# Patient Record
Sex: Female | Born: 1980 | Race: White | Hispanic: Yes | Marital: Single | Smoking: Never smoker
Health system: Southern US, Community
[De-identification: ages and names within clinical notes are randomized; demographics above are authoritative.]

## PROBLEM LIST (undated history)

## (undated) ENCOUNTER — Inpatient Hospital Stay (HOSPITAL_COMMUNITY): Payer: Self-pay

## (undated) DIAGNOSIS — A749 Chlamydial infection, unspecified: Secondary | ICD-10-CM

## (undated) DIAGNOSIS — O039 Complete or unspecified spontaneous abortion without complication: Secondary | ICD-10-CM

## (undated) DIAGNOSIS — F419 Anxiety disorder, unspecified: Secondary | ICD-10-CM

## (undated) DIAGNOSIS — R7303 Prediabetes: Secondary | ICD-10-CM

## (undated) DIAGNOSIS — M1990 Unspecified osteoarthritis, unspecified site: Secondary | ICD-10-CM

## (undated) DIAGNOSIS — E785 Hyperlipidemia, unspecified: Secondary | ICD-10-CM

## (undated) HISTORY — DX: Unspecified osteoarthritis, unspecified site: M19.90

## (undated) HISTORY — DX: Prediabetes: R73.03

## (undated) HISTORY — PX: NO PAST SURGERIES: SHX2092

## (undated) HISTORY — DX: Hyperlipidemia, unspecified: E78.5

---

## 2007-03-26 ENCOUNTER — Emergency Department (HOSPITAL_COMMUNITY): Admission: EM | Admit: 2007-03-26 | Discharge: 2007-03-26 | Payer: Self-pay | Admitting: Emergency Medicine

## 2007-10-06 ENCOUNTER — Emergency Department (HOSPITAL_COMMUNITY): Admission: EM | Admit: 2007-10-06 | Discharge: 2007-10-07 | Payer: Self-pay | Admitting: Emergency Medicine

## 2007-11-17 ENCOUNTER — Emergency Department (HOSPITAL_COMMUNITY): Admission: EM | Admit: 2007-11-17 | Discharge: 2007-11-17 | Payer: Self-pay | Admitting: Emergency Medicine

## 2007-11-26 ENCOUNTER — Emergency Department (HOSPITAL_COMMUNITY): Admission: EM | Admit: 2007-11-26 | Discharge: 2007-11-27 | Payer: Self-pay | Admitting: Emergency Medicine

## 2007-12-03 ENCOUNTER — Emergency Department (HOSPITAL_COMMUNITY): Admission: EM | Admit: 2007-12-03 | Discharge: 2007-12-03 | Payer: Self-pay | Admitting: Emergency Medicine

## 2007-12-16 ENCOUNTER — Emergency Department (HOSPITAL_COMMUNITY): Admission: EM | Admit: 2007-12-16 | Discharge: 2007-12-17 | Payer: Self-pay | Admitting: Emergency Medicine

## 2008-01-18 ENCOUNTER — Ambulatory Visit (HOSPITAL_COMMUNITY): Admission: RE | Admit: 2008-01-18 | Discharge: 2008-01-18 | Payer: Self-pay | Admitting: Obstetrics & Gynecology

## 2008-03-18 ENCOUNTER — Ambulatory Visit (HOSPITAL_COMMUNITY): Admission: RE | Admit: 2008-03-18 | Discharge: 2008-03-18 | Payer: Self-pay | Admitting: Obstetrics & Gynecology

## 2008-04-14 ENCOUNTER — Ambulatory Visit: Payer: Self-pay | Admitting: Physician Assistant

## 2008-04-14 ENCOUNTER — Inpatient Hospital Stay (HOSPITAL_COMMUNITY): Admission: AD | Admit: 2008-04-14 | Discharge: 2008-04-14 | Payer: Self-pay | Admitting: Family Medicine

## 2008-05-16 ENCOUNTER — Inpatient Hospital Stay (HOSPITAL_COMMUNITY): Admission: AD | Admit: 2008-05-16 | Discharge: 2008-05-16 | Payer: Self-pay | Admitting: Family Medicine

## 2008-05-23 ENCOUNTER — Inpatient Hospital Stay (HOSPITAL_COMMUNITY): Admission: AD | Admit: 2008-05-23 | Discharge: 2008-05-25 | Payer: Self-pay | Admitting: Obstetrics & Gynecology

## 2008-05-23 ENCOUNTER — Ambulatory Visit: Payer: Self-pay | Admitting: Obstetrics and Gynecology

## 2008-12-25 ENCOUNTER — Emergency Department (HOSPITAL_COMMUNITY): Admission: EM | Admit: 2008-12-25 | Discharge: 2008-12-25 | Payer: Self-pay | Admitting: Emergency Medicine

## 2009-07-06 IMAGING — US US OB DETAIL+14 WK
2 series · 14 of 28 positions shown · non-contrast
Comparison: none

OBSTETRICAL ULTRASOUND:
 This ultrasound exam was performed in the [HOSPITAL] Ultrasound Department.  The OB US report was generated in the AS system, and faxed to the ordering physician.  This report is also available in [REDACTED] PACS.

[Series 1: us ob detail +14 wk · 1 of 6 slices shown (1 of 2)]
[im 6/6]
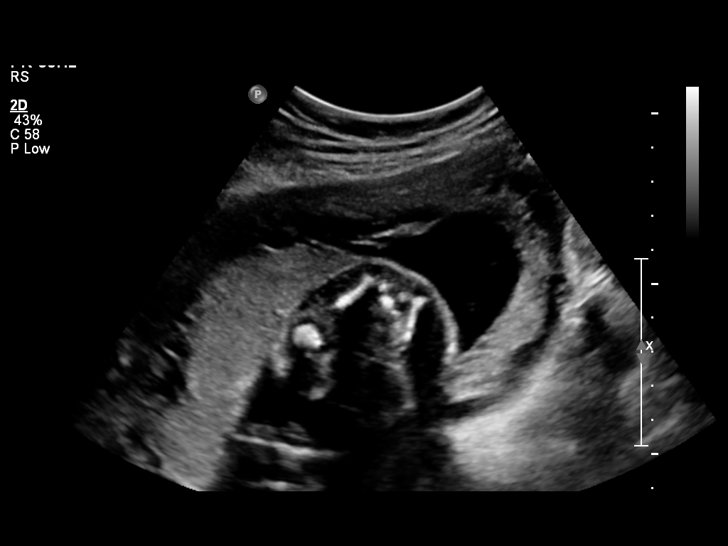

[Series 1: us ob detail +14 wk · 73 acquisitions, 13 frames shown (2 of 2)]
[im 3/73]
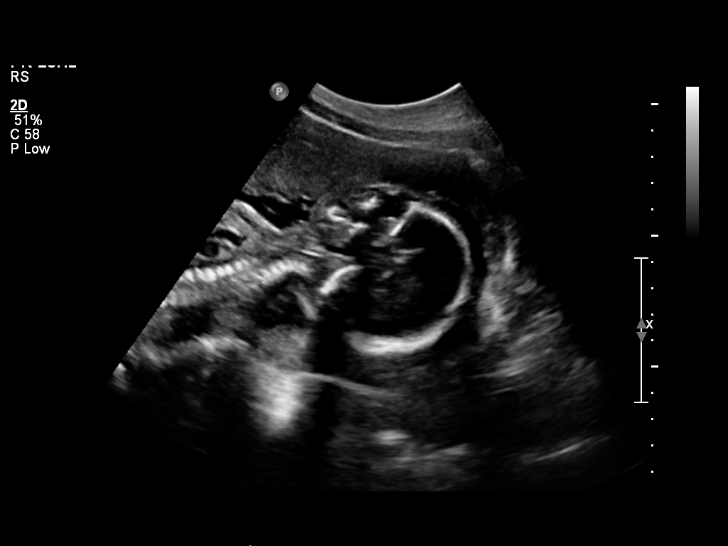
[im 9/73]
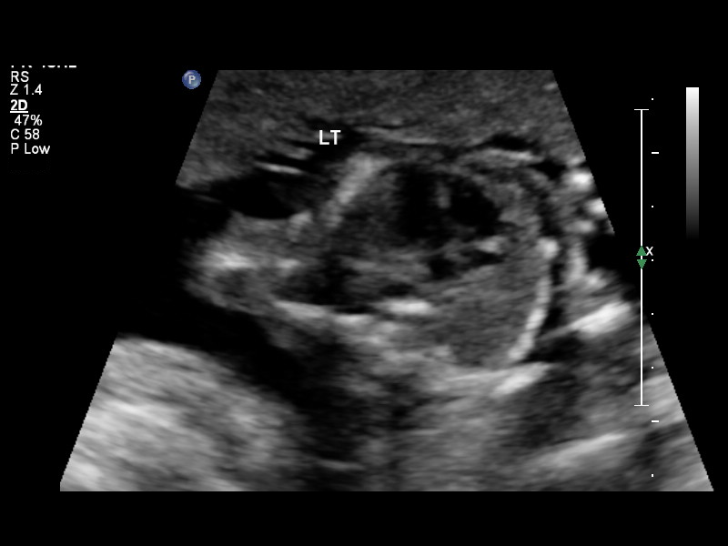
[im 15/73]
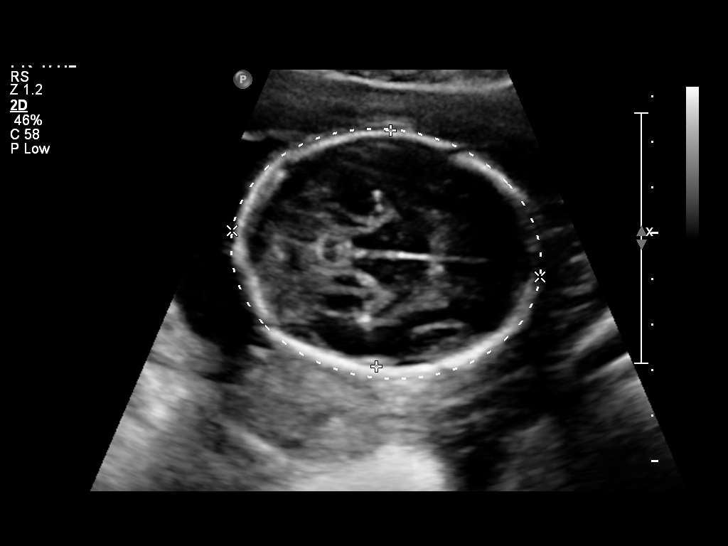
[im 21/73]
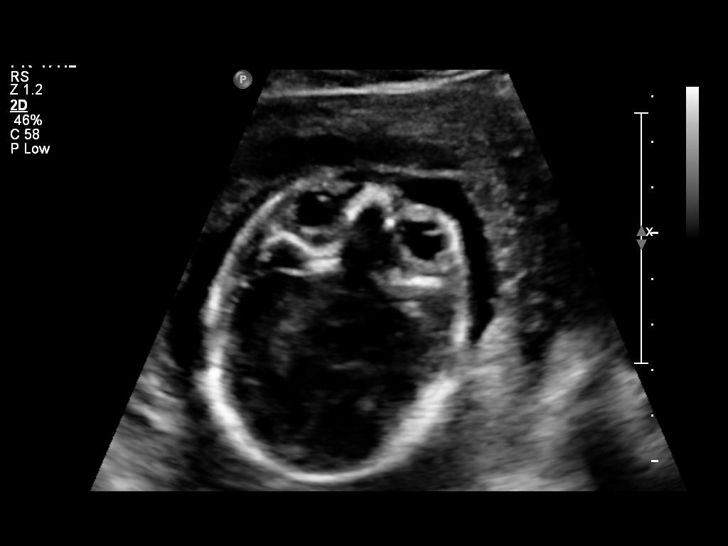
[im 26/73]
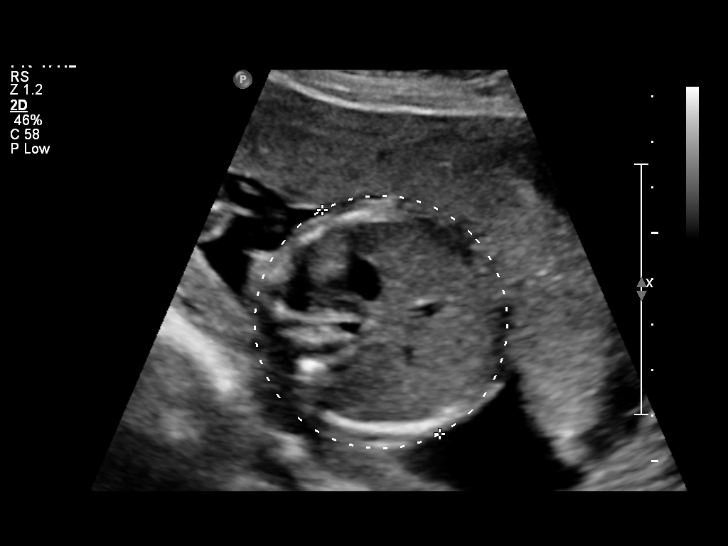
[im 32/73]
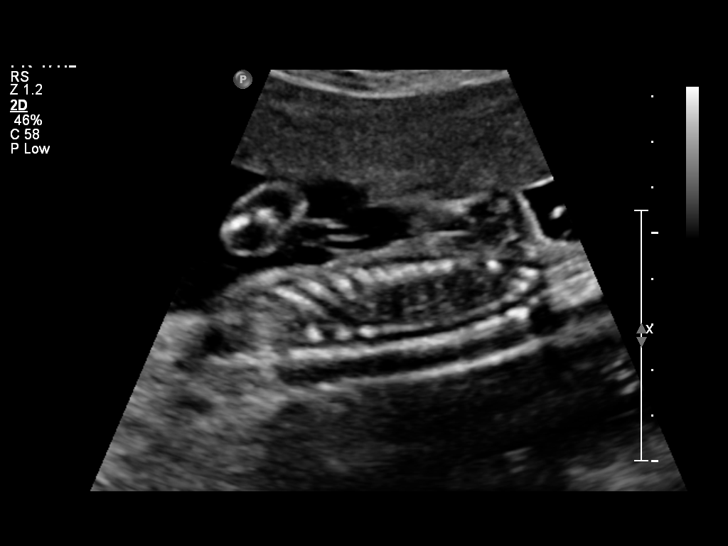
[im 38/73]
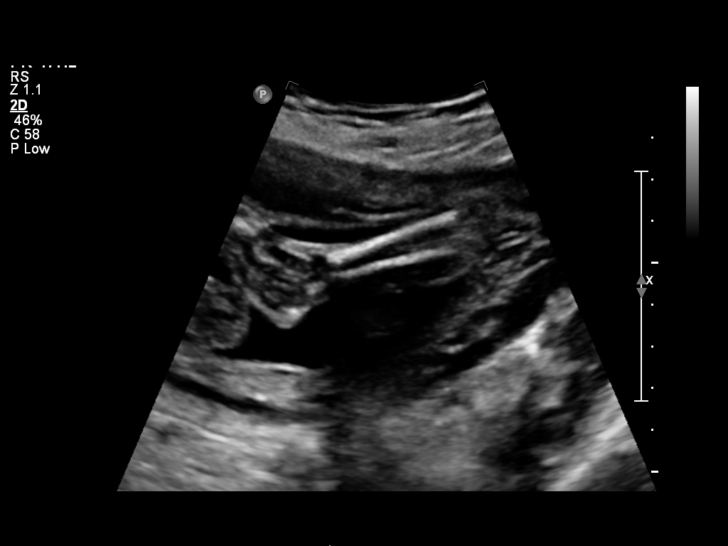
[im 44/73]
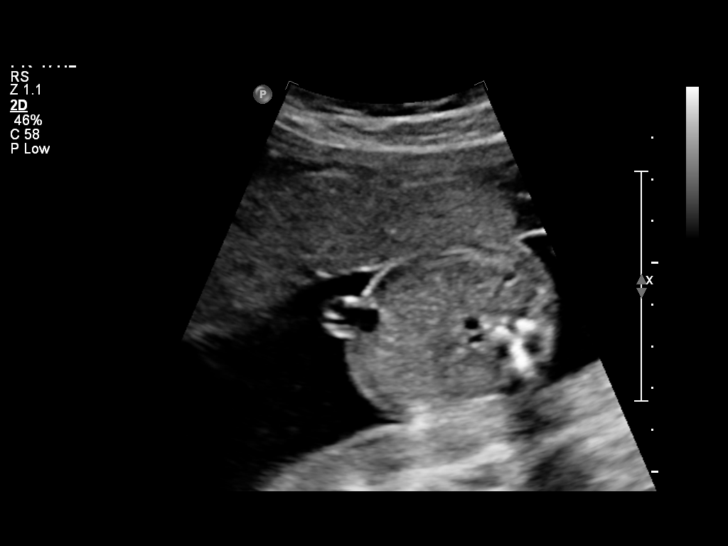
[im 49/73]
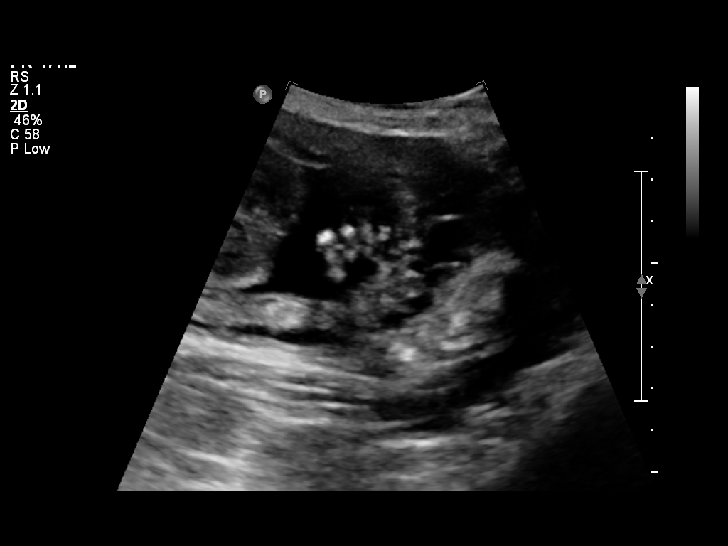
[im 55/73]
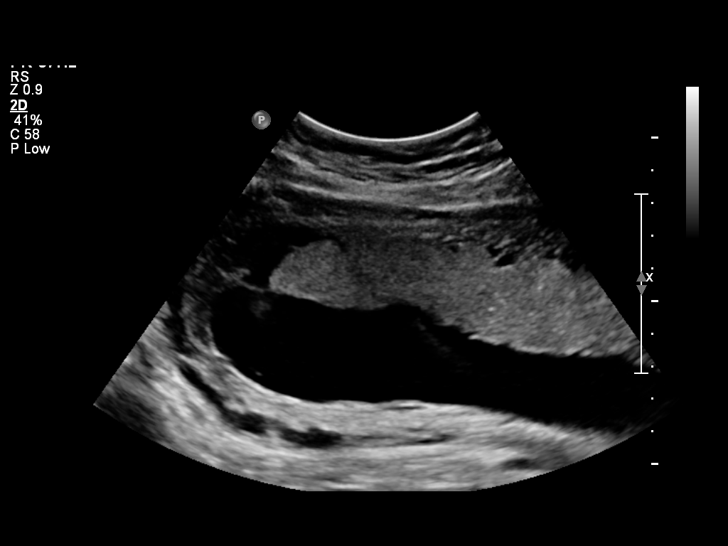
[im 61/73]
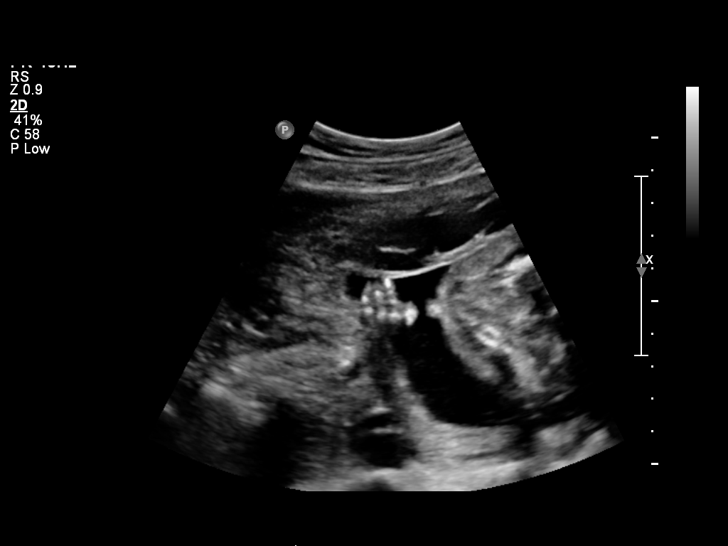
[im 67/73]
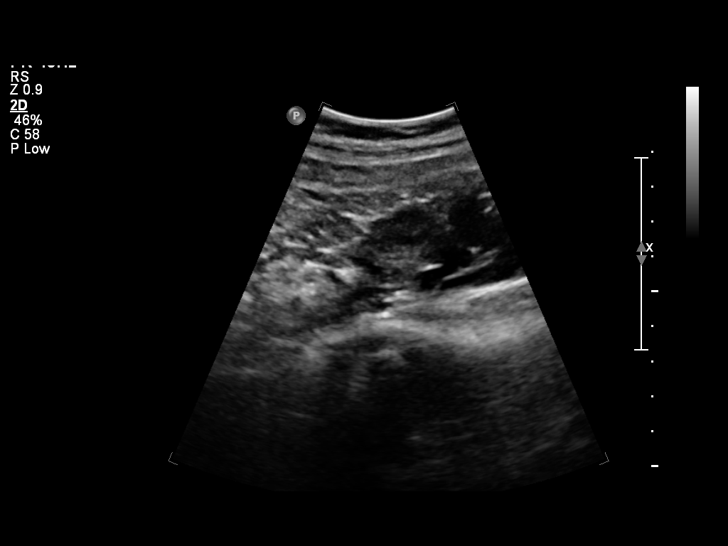
[im 73/73]
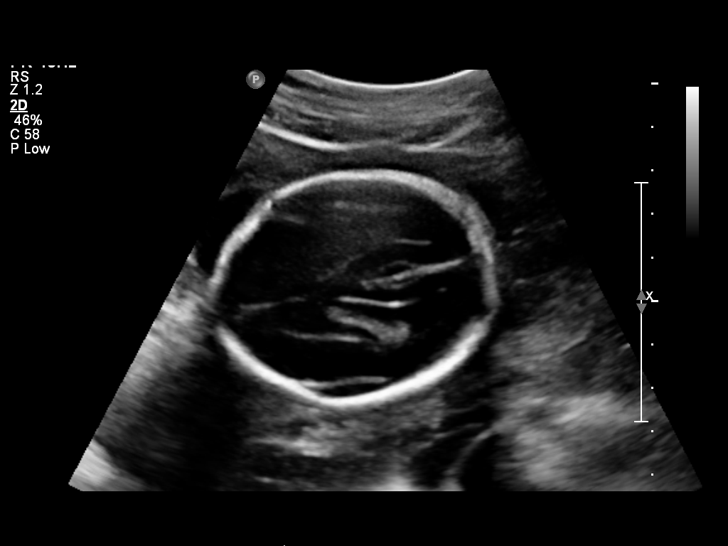

[14 of 28 positions shown; findings below may reference images not displayed]

IMPRESSION: See AS Obstetric US report.

## 2009-09-26 ENCOUNTER — Ambulatory Visit (HOSPITAL_COMMUNITY): Admission: RE | Admit: 2009-09-26 | Discharge: 2009-09-26 | Payer: Self-pay | Admitting: Family Medicine

## 2010-01-11 ENCOUNTER — Ambulatory Visit (HOSPITAL_COMMUNITY): Admission: RE | Admit: 2010-01-11 | Discharge: 2010-01-11 | Payer: Self-pay | Admitting: Family Medicine

## 2010-01-17 ENCOUNTER — Ambulatory Visit: Payer: Self-pay | Admitting: Advanced Practice Midwife

## 2010-01-17 ENCOUNTER — Inpatient Hospital Stay (HOSPITAL_COMMUNITY): Admission: AD | Admit: 2010-01-17 | Discharge: 2010-01-19 | Payer: Self-pay | Admitting: Family Medicine

## 2010-08-10 LAB — CBC
MCH: 31.2 pg (ref 26.0–34.0)
MCV: 94 fL (ref 78.0–100.0)
RBC: 3.75 MIL/uL — ABNORMAL LOW (ref 3.87–5.11)
RDW: 13.1 % (ref 11.5–15.5)

## 2010-08-10 LAB — RPR: RPR Ser Ql: NONREACTIVE

## 2011-02-21 LAB — URINALYSIS, ROUTINE W REFLEX MICROSCOPIC
Bilirubin Urine: NEGATIVE
Bilirubin Urine: NEGATIVE
Glucose, UA: NEGATIVE
Hgb urine dipstick: NEGATIVE
Nitrite: NEGATIVE
Protein, ur: NEGATIVE
Specific Gravity, Urine: 1.014
Urobilinogen, UA: 0.2
Urobilinogen, UA: 1
pH: 8
pH: 5.5

## 2011-02-21 LAB — CBC
HCT: 34.9 — ABNORMAL LOW
Hemoglobin: 11.9 — ABNORMAL LOW
MCHC: 34
MCV: 94.8
Platelets: 156
RDW: 12.7

## 2011-02-21 LAB — URINE MICROSCOPIC-ADD ON

## 2011-02-21 LAB — URINE CULTURE

## 2011-02-21 LAB — WET PREP, GENITAL
Clue Cells Wet Prep HPF POC: NONE SEEN
Yeast Wet Prep HPF POC: NONE SEEN

## 2011-02-21 LAB — GC/CHLAMYDIA PROBE AMP, URINE
Chlamydia, Swab/Urine, PCR: POSITIVE — AB
GC Probe Amp, Urine: NEGATIVE

## 2011-02-21 LAB — DIFFERENTIAL
Basophils Absolute: 0
Basophils Relative: 0
Lymphocytes Relative: 24

## 2011-02-21 LAB — POCT I-STAT, CHEM 8
Chloride: 106
Creatinine, Ser: 0.7
HCT: 35 — ABNORMAL LOW
Hemoglobin: 11.9 — ABNORMAL LOW
TCO2: 19

## 2011-02-21 LAB — RPR
RPR Ser Ql: NONREACTIVE
RPR Ser Ql: NONREACTIVE
RPR Ser Ql: NONREACTIVE

## 2011-02-21 LAB — GC/CHLAMYDIA PROBE AMP, GENITAL
Chlamydia, DNA Probe: POSITIVE — AB
GC Probe Amp, Genital: NEGATIVE
GC Probe Amp, Genital: NEGATIVE

## 2011-02-21 LAB — HCG, QUANTITATIVE, PREGNANCY: hCG, Beta Chain, Quant, S: 50800 — ABNORMAL HIGH

## 2011-02-22 LAB — URINALYSIS, ROUTINE W REFLEX MICROSCOPIC
Hgb urine dipstick: NEGATIVE
Nitrite: NEGATIVE
Specific Gravity, Urine: 1.01
Urobilinogen, UA: 0.2
pH: 7

## 2011-02-22 LAB — GC/CHLAMYDIA PROBE AMP, GENITAL: Chlamydia, DNA Probe: NEGATIVE

## 2011-02-22 LAB — URINE MICROSCOPIC-ADD ON

## 2011-02-22 LAB — URINE CULTURE

## 2011-02-22 LAB — WET PREP, GENITAL

## 2011-02-22 LAB — RPR: RPR Ser Ql: NONREACTIVE

## 2011-02-26 LAB — URINALYSIS, ROUTINE W REFLEX MICROSCOPIC
Ketones, ur: NEGATIVE
Nitrite: NEGATIVE
Specific Gravity, Urine: 1.025
pH: 6

## 2011-03-01 LAB — CBC
HCT: 39.2 % (ref 36.0–46.0)
Hemoglobin: 13.2 g/dL (ref 12.0–15.0)
RDW: 13.2 % (ref 11.5–15.5)

## 2011-03-01 LAB — URINALYSIS, ROUTINE W REFLEX MICROSCOPIC
Bilirubin Urine: NEGATIVE
Glucose, UA: NEGATIVE mg/dL
Hgb urine dipstick: NEGATIVE
Ketones, ur: NEGATIVE mg/dL
Nitrite: NEGATIVE
Protein, ur: NEGATIVE mg/dL
Specific Gravity, Urine: <1.005 — ABNORMAL LOW (ref 1.005–1.030)
Urobilinogen, UA: 0.2 mg/dL (ref 0.0–1.0)
pH: 6 (ref 5.0–8.0)

## 2011-03-01 LAB — GC/CHLAMYDIA PROBE AMP, GENITAL
Chlamydia, DNA Probe: NEGATIVE
GC Probe Amp, Genital: NEGATIVE

## 2011-03-01 LAB — WET PREP, GENITAL
Clue Cells Wet Prep HPF POC: NONE SEEN
Trich, Wet Prep: NONE SEEN
Yeast Wet Prep HPF POC: NONE SEEN

## 2011-04-17 ENCOUNTER — Emergency Department (HOSPITAL_COMMUNITY)
Admission: EM | Admit: 2011-04-17 | Discharge: 2011-04-17 | Disposition: A | Discharge status: Home / Self Care | Payer: Medicaid Other | Attending: Emergency Medicine | Admitting: Emergency Medicine

## 2011-04-17 ENCOUNTER — Emergency Department (HOSPITAL_COMMUNITY): Payer: Medicaid Other

## 2011-04-17 DIAGNOSIS — Z3490 Encounter for supervision of normal pregnancy, unspecified, unspecified trimester: Secondary | ICD-10-CM

## 2011-04-17 DIAGNOSIS — R5381 Other malaise: Secondary | ICD-10-CM | POA: Insufficient documentation

## 2011-04-17 DIAGNOSIS — N898 Other specified noninflammatory disorders of vagina: Secondary | ICD-10-CM | POA: Insufficient documentation

## 2011-04-17 DIAGNOSIS — R10813 Right lower quadrant abdominal tenderness: Secondary | ICD-10-CM | POA: Insufficient documentation

## 2011-04-17 DIAGNOSIS — R110 Nausea: Secondary | ICD-10-CM | POA: Insufficient documentation

## 2011-04-17 DIAGNOSIS — O99891 Other specified diseases and conditions complicating pregnancy: Secondary | ICD-10-CM | POA: Insufficient documentation

## 2011-04-17 DIAGNOSIS — R1031 Right lower quadrant pain: Secondary | ICD-10-CM | POA: Insufficient documentation

## 2011-04-17 DIAGNOSIS — R51 Headache: Secondary | ICD-10-CM | POA: Insufficient documentation

## 2011-04-17 LAB — CBC
HCT: 37.9 % (ref 36.0–46.0)
MCHC: 32.7 g/dL (ref 30.0–36.0)
Platelets: 185 10*3/uL (ref 150–400)
RDW: 12.8 % (ref 11.5–15.5)
WBC: 7.3 10*3/uL (ref 4.0–10.5)

## 2011-04-17 LAB — URINALYSIS, ROUTINE W REFLEX MICROSCOPIC
Bilirubin Urine: NEGATIVE
Leukocytes, UA: NEGATIVE
Nitrite: NEGATIVE
Specific Gravity, Urine: 1.031 — ABNORMAL HIGH (ref 1.005–1.030)
Urobilinogen, UA: 1 mg/dL (ref 0.0–1.0)
pH: 6 (ref 5.0–8.0)

## 2011-04-17 LAB — BASIC METABOLIC PANEL
CO2: 21 mEq/L (ref 19–32)
Chloride: 105 mEq/L (ref 96–112)
GFR calc Af Amer: >90 mL/min (ref >90)
Potassium: 3.5 mEq/L (ref 3.5–5.1)
Sodium: 136 mEq/L (ref 135–145)

## 2011-04-17 LAB — TYPE AND SCREEN: Antibody Screen: NEGATIVE

## 2011-04-17 LAB — DIFFERENTIAL
Basophils Absolute: 0 10*3/uL (ref 0.0–0.1)
Basophils Relative: 0 % (ref 0–1)
Lymphocytes Relative: 22 % (ref 12–46)
Monocytes Absolute: 0.5 10*3/uL (ref 0.1–1.0)
Neutro Abs: 5.1 10*3/uL (ref 1.7–7.7)
Neutrophils Relative %: 69 % (ref 43–77)

## 2011-04-17 LAB — HCG, QUANTITATIVE, PREGNANCY: hCG, Beta Chain, Quant, S: 16834 m[IU]/mL — ABNORMAL HIGH (ref <5)

## 2011-04-17 LAB — WET PREP, GENITAL
Clue Cells Wet Prep HPF POC: NONE SEEN
Yeast Wet Prep HPF POC: NONE SEEN

## 2011-04-17 MED ORDER — SODIUM CHLORIDE 0.9 % IV BOLUS (SEPSIS)
1000.0000 mL | Freq: Once | INTRAVENOUS | Status: AC
  Administered 2011-04-17: 1000 mL via INTRAVENOUS

## 2011-04-17 MED ORDER — ONDANSETRON HCL 4 MG/2ML IJ SOLN
4.0000 mg | Freq: Once | INTRAMUSCULAR | Status: AC
  Administered 2011-04-17: 4 mg via INTRAVENOUS
  Filled 2011-04-17: qty 2

## 2011-04-17 NOTE — ED Notes (Signed)
Patient is resting comfortably. 

## 2011-04-17 NOTE — ED Notes (Signed)
Patient states vaginal bleeding few days took pregnancy test Positive at home.  Patient states did not know she was pregnant.  Resting comfortably on stretcher.  Airway intact bilateral equal chest rise and fall.

## 2011-04-17 NOTE — ED Provider Notes (Signed)
Medical screening examination/treatment/procedure(s) were conducted as a shared visit with non-physician practitioner(s) and myself.  I personally evaluated the patient during the encounter  Please see my dictation for complete H&P  Cyndra Numbers, MD 04/17/11 2252

## 2011-04-17 NOTE — ED Notes (Signed)
Pt reports 1 week h/o RLQ abdominal pain and headache.  Pt reports pain radiates around to her back and across abdomen.  Pt reports vaginal bleeding x 3 days. Pt reports nausea.  Pt reports she is 2 months pregnant.

## 2011-04-17 NOTE — ED Provider Notes (Signed)
History     CSN: [id number] Arrival date & time: 04/17/2011  1:39 PM   First MD Initiated Contact with Patient 04/17/11 1400      Chief Complaint  Patient presents with  . Abdominal Pain    (Consider location/radiation/quality/duration/timing/severity/associated sxs/prior treatment) Patient is a 30 y.o. female presenting with abdominal pain.  Abdominal Pain The primary symptoms of the illness include abdominal pain, fatigue, nausea and vaginal bleeding. The primary symptoms of the illness do not include diarrhea, dysuria or vaginal discharge.  Symptoms associated with the illness do not include constipation or hematuria.   The patient is a 30 year old G6 P5 with last menstrual period on September 29 who presents today complaining of right lower quadrant abdominal pain. She's had some associated nausea and headache. She also describes lower back pain. The patient had vaginal bleeding that was noted for 4 days and occurred approximately 8 days ago. The patient did not know that she was pregnant at the time. She took a urine pregnancy test yesterday that was positive. The patient has had 5 prior vaginal deliveries without complications. Patient does not have any vaginal bleeding at this time. She has had no fevers but does endorse decreased appetite. She states that her pain is a 10 out 10. The patient does still have an appendix. She has never had surgery on her abdomen. She states that this is very different from previous pregnancies. She has a history of STDs but does not have any vaginal discharge that concerns her for this. There are no other associated or modifying factors. This patient is Spanish-speaking only and her complete physical exam we'll perform a myself in Spanish patient is comfortable my interpretation. History reviewed. No pertinent past medical history.  History reviewed. No pertinent past surgical history.  History reviewed. No pertinent family history.  History    Substance Use Topics  . Smoking status: Never Smoker   . Smokeless tobacco: Not on file  . Alcohol Use: No    OB History    Grav Para Term Preterm Abortions TAB SAB Ect Mult Living                  Review of Systems  Constitutional: Positive for appetite change and fatigue.  HENT: Negative.   Eyes: Negative.   Respiratory: Negative.   Cardiovascular: Negative.   Gastrointestinal: Positive for nausea and abdominal pain. Negative for diarrhea and constipation.  Genitourinary: Positive for vaginal bleeding. Negative for dysuria, hematuria and vaginal discharge.  Musculoskeletal: Negative.   Skin: Negative.   Neurological: Negative.   Hematological: Negative.   Psychiatric/Behavioral: Negative.   All other systems reviewed and are negative.    Allergies  Review of patient's allergies indicates no known allergies.  Home Medications  No current outpatient prescriptions on file.  BP 97/61  Pulse 66  Temp(Src) 97.8 F (36.6 C) (Oral)  Resp 14  SpO2 98%  LMP 02/23/2011  Physical Exam  Nursing note and vitals reviewed. Constitutional: She is oriented to person, place, and time. She appears well-developed and well-nourished. No distress.  HENT:  Head: Normocephalic and atraumatic.  Eyes: Conjunctivae and EOM are normal. Pupils are equal, round, and reactive to light.  Neck: Normal range of motion.  Cardiovascular: Normal rate, regular rhythm, normal heart sounds and intact distal pulses.  Exam reveals no gallop and no friction rub.   No murmur heard. Pulmonary/Chest: Effort normal and breath sounds normal. No respiratory distress. She has no wheezes. She has no rales.  Abdominal:  Normal appearance and bowel sounds are normal. There is tenderness in the right lower quadrant. There is tenderness at McBurney's point. There is no rigidity, no rebound, no guarding and no CVA tenderness.  Genitourinary:       Please see dictation by PA for GU exam.  Musculoskeletal: Normal  range of motion. She exhibits no edema and no tenderness.  Neurological: She is alert and oriented to person, place, and time. No cranial nerve deficit. She exhibits normal muscle tone. Coordination normal.  Skin: Skin is warm and dry. No rash noted.  Psychiatric: She has a normal mood and affect.    ED Course  Procedures (including critical care time)  Labs Reviewed  HCG, QUANTITATIVE, PREGNANCY - Abnormal; Notable for the following:    hCG, Beta Chain, Quant, [place] [address] (*)    All other components within normal limits  URINALYSIS, ROUTINE W REFLEX MICROSCOPIC - Abnormal; Notable for the following:    Specific Gravity, Urine 1.031 (*)    Ketones, ur 15 (*)    All other components within normal limits  WET PREP, GENITAL - Abnormal; Notable for the following:    WBC, Wet Prep HPF POC MODERATE (*)    All other components within normal limits  CBC  DIFFERENTIAL  BASIC METABOLIC PANEL  PREGNANCY, URINE  TYPE AND SCREEN  URINE CULTURE  GC/CHLAMYDIA PROBE AMP, GENITAL   Mr Abdomen Wo Contrast  04/17/2011  *RADIOLOGY REPORT*  Clinical Data: Pregnant, right lower quadrant pain, evaluate for appendicitis  MRI ABDOMEN WITHOUT CONTRAST  Technique:  Multiplanar multisequence MR imaging of the abdomen was performed. No intravenous contrast was administered.  Comparison: None.  Findings: Visualized bowel is unremarkable.  Appendix is not confidently visualized, but there are no inflammatory changes to suggest acute appendicitis.  Visualized portions of the liver and spleen are within normal limits.  The visualized kidneys are unremarkable.  No hydronephrosis.  The visualized bowel is within normal limits.  Intrauterine gestational sac.  Ovaries are unremarkable, noting a left corpus luteal cyst.  No pelvic ascites.  Bladder is within normal limits.  No focal osseous lesions.  IMPRESSION: No inflammatory changes to suggest acute appendicitis.  Gravid uterus.  Left corpus luteal cyst.  Original Report  Authenticated By: Charline Bills, M.D.   US Ob Comp Less 14 Wks  04/17/2011  *RADIOLOGY REPORT*  Clinical Data: Positive pregnancy test and right lower quadrant pain.  Evaluate for ectopic pregnancy.  OBSTETRIC <14 WK [place] AND TRANSVAGINAL OB US  Technique:  Both transabdominal and transvaginal ultrasound examinations were performed for complete evaluation of the gestation as well as the maternal uterus, adnexal regions, and pelvic cul-de-sac.  Transvaginal technique was performed to assess early pregnancy.  Comparison:  11/17/2007  Intrauterine gestational sac:  Visualized. Yolk sac: Present Embryo: Question an early fetal pole.  Cardiac Activity: None  MSD: 14  mm  6    w 2    d          [place] EDC: 12/09/2011  Maternal uterus/adnexae: There is a gestational sac along the right side of the uterine fundus.  There may be a very small fetal pole adjacent to the yolk sac.  Right ovary measures 2.1 x 2.6 x 1.3 cm.  Left ovary measures 4.2 x 2.5 x 2.4 cm. There is a hypoechoic structure within the left adnexa measuring up to 1.9 cm, suggesting a follicle or small cyst. No evidence for free fluid.  IMPRESSION: Study is positive for an intrauterine pregnancy. Gestational  age by ultrasound is 6 weeks and 2 days.  Original Report Authenticated By: Richarda Overlie, M.D.   US Ob Transvaginal  04/17/2011  *RADIOLOGY REPORT*  Clinical Data: Positive pregnancy test and right lower quadrant pain.  Evaluate for ectopic pregnancy.  OBSTETRIC <14 WK [place] AND TRANSVAGINAL OB US  Technique:  Both transabdominal and transvaginal ultrasound examinations were performed for complete evaluation of the gestation as well as the maternal uterus, adnexal regions, and pelvic cul-de-sac.  Transvaginal technique was performed to assess early pregnancy.  Comparison:  11/17/2007  Intrauterine gestational sac:  Visualized. Yolk sac: Present Embryo: Question an early fetal pole.  Cardiac Activity: None  MSD: 14  mm  6    w 2    d          [place] EDC:  12/09/2011  Maternal uterus/adnexae: There is a gestational sac along the right side of the uterine fundus.  There may be a very small fetal pole adjacent to the yolk sac.  Right ovary measures 2.1 x 2.6 x 1.3 cm.  Left ovary measures 4.2 x 2.5 x 2.4 cm. There is a hypoechoic structure within the left adnexa measuring up to 1.9 cm, suggesting a follicle or small cyst. No evidence for free fluid.  IMPRESSION: Study is positive for an intrauterine pregnancy. Gestational age by ultrasound is 6 weeks and 2 days.  Original Report Authenticated By: Richarda Overlie, M.D.     1. Abdominal pain, acute, right lower quadrant   2. Normal IUP (intrauterine pregnancy) on prenatal ultrasound       MDM  The patient was evaluated by myself. She did have positive urine pregnancy here. She was not currently having vaginal bleeding but was having right lower quadrant pain. Patient had workup ordered for possible ectopic pregnancy. Renal panel and CBC were within normal limits. The patient is Rh+. Her CBC showed no signs of anemia. Patient's urinalysis showed no bacteria urine or signs of infection. Patient had a beta quant of [id number].  Patient did have a transvaginal ultrasound that was positive for intrauterine pregnancy with visualized yolk sac as well as intrauterine gestational sac and possible early fetal pole. Gestational age by ultrasound is 6 weeks and 2 days. The patient did have pelvic exam performed and did have adnexal tenderness on the right. I spoke with the patient regarding her findings and she continued to be very concerned about her right lower quadrant pain. She also continued to have tenderness in this area. We discussed the risks and benefits of additional imaging. After speaking with radiology was felt that an MRI was appropriate tests performed. This demonstrated no signs of appendicitis. Patient was notified of results. Patient was comfortable with plan for discharge home and was given precautions for  reasons to return. She will followup with an OB/GYN and was discharged in good condition.       Cyndra Numbers, MD 04/17/11 2251

## 2011-04-17 NOTE — ED Provider Notes (Signed)
Pt's pelvic exam performed by me. Normal external genetelia. White thin vaginal discharge. Cervix closed, no CMT, no bleeding. Right adnexal tenderness. Uterus gravid.   Lottie Mussel, PA 04/17/11 2250

## 2011-04-18 LAB — URINE CULTURE: Culture  Setup Time: 201211211500

## 2011-05-25 ENCOUNTER — Encounter (HOSPITAL_COMMUNITY): Payer: Self-pay

## 2011-05-25 ENCOUNTER — Emergency Department (HOSPITAL_COMMUNITY)
Admission: AD | Admit: 2011-05-25 | Discharge: 2011-05-25 | Disposition: A | Discharge status: Other Acute Inpatient Hospital | Payer: Medicaid Other | Source: Ambulatory Visit | Attending: Obstetrics & Gynecology | Admitting: Obstetrics & Gynecology

## 2011-05-25 ENCOUNTER — Emergency Department (HOSPITAL_COMMUNITY): Payer: Self-pay

## 2011-05-25 DIAGNOSIS — N939 Abnormal uterine and vaginal bleeding, unspecified: Secondary | ICD-10-CM

## 2011-05-25 DIAGNOSIS — N898 Other specified noninflammatory disorders of vagina: Secondary | ICD-10-CM | POA: Insufficient documentation

## 2011-05-25 DIAGNOSIS — O0289 Other abnormal products of conception: Secondary | ICD-10-CM

## 2011-05-25 DIAGNOSIS — R1031 Right lower quadrant pain: Secondary | ICD-10-CM | POA: Insufficient documentation

## 2011-05-25 DIAGNOSIS — O020 Blighted ovum and nonhydatidiform mole: Secondary | ICD-10-CM

## 2011-05-25 HISTORY — DX: Chlamydial infection, unspecified: A74.9

## 2011-05-25 LAB — URINALYSIS, ROUTINE W REFLEX MICROSCOPIC
Glucose, UA: NEGATIVE mg/dL
Leukocytes, UA: NEGATIVE
pH: 6 (ref 5.0–8.0)

## 2011-05-25 LAB — WET PREP, GENITAL
Clue Cells Wet Prep HPF POC: NONE SEEN
Trich, Wet Prep: NONE SEEN
Yeast Wet Prep HPF POC: NONE SEEN

## 2011-05-25 MED ORDER — HYDROCODONE-ACETAMINOPHEN 5-500 MG PO TABS: 1.0000 | ORAL_TABLET | Freq: Four times a day (QID) | ORAL | Status: AC | PRN

## 2011-05-25 MED ORDER — MISOPROSTOL 200 MCG PO TABS: ORAL_TABLET | ORAL | Status: DC

## 2011-05-25 NOTE — ED Notes (Signed)
Patient transported to Ultrasound 

## 2011-05-25 NOTE — ED Provider Notes (Signed)
Pt not accepted by Ma Hillock on call MD since she has established relationship with [facility]. D/w Dr Debroah Loop (on call MD), accepting patient.  --V.Juliene Pina, MD  HPI Review of Systems Physical Exam Procedures

## 2011-05-25 NOTE — ED Notes (Signed)
Pt. Is approximately [redacted] weeks pregnant and she began mild bleeding last night with cramping.

## 2011-05-25 NOTE — ED Provider Notes (Signed)
History     Chief Complaint  Patient presents with  . Vaginal Bleeding  . Abdominal Cramping   HPIPatient's last menstrual period was 02/23/2011. [id number] Unknown Patient was seen at [facility] and transferred per Dr. Juliene Pina. Pt has appt at [facility] for [facility]. Delivered at [facility] 03/2010. US showed empty sac c/w anembryonic pregnancy, pt report cramping and light bleeding today, no tissue or clots.  OB History    Grav Para Term Preterm Abortions TAB SAB Ect Mult Living   6 5 5       5       Past Medical History  Diagnosis Date  . Chlamydia     Past Surgical History  Procedure Date  . No past surgeries     History reviewed. No pertinent family history.  History  Substance Use Topics  . Smoking status: Never Smoker   . Smokeless tobacco: Never Used  . Alcohol Use: No    Allergies: No Known Allergies Past Medical History  Diagnosis Date  . Chlamydia      (Not in a hospital admission)  ROS cramps, bleeding  Physical Exam   Blood pressure 104/71, pulse 66, temperature 98.7 F (37.1 C), temperature source Oral, resp. rate 20, height 5\' 3"  (1.6 m), weight 140 lb (63.504 kg), last menstrual period 02/23/2011, SpO2 97.00%.  Physical Exam NAD  Interpreter present Abd non tender, soft Pelvic  EBUS, Vagina, Cervix-small amount of blood, closed, not tender, uterus 6-8 weeks, no mass   *RADIOLOGY REPORT*  Clinical Data: Vaginal bleeding. The estimated gestational age by  LMP is 13 weeks 0 days.  OBSTETRIC <14 WK [place] AND TRANSVAGINAL OB US  Technique: Both transabdominal and transvaginal ultrasound  examinations were performed for complete evaluation of the  gestation as well as the maternal uterus, adnexal regions, and  pelvic cul-de-sac. Transvaginal technique was performed to assess  early pregnancy.  Comparison: Obstetric ultrasound 04/17/2011  Intrauterine gestational sac: Enlarged gestational sac is  visualized  Yolk sac: None  Embryo: None  MSD: 45 mm 10w 0 d  Maternal  uterus/adnexae:  A corpus luteum noted in the left ovary. Right ovary is normal.  No free pelvic fluid.  IMPRESSION:  Failed (anembryonic) first trimester pregnancy.  (Previously visualized yolk sac on the study of 04/17/2011 is no  longer present and no embryo is present on today's study. We would  expect to see an embryo at this time, given the appearances on the  prior ultrasound of 04/17/2011, and gestational sac size.)  Original Report Authenticated By: Britta Mccreedy, M.D.      Imaging     US OB Comp Less 14 Wks (Order #[id number]) on 05/25/2011 - Imaging Information           Early Intrauterine Pregnancy Failure  ___  Documented intrauterine pregnancy failure less than or equal to [redacted] weeks gestation  ___  No serious current illness  ___  Baseline Hgb greater than or equal to 10g/dl  ___  Patient has easily accessible transportation to the hospital  ___  Clear preference  ___  Practitioner/physician deems patient reliable  ___  Counseling by practitioner or physician  ___  Patient education by RN  ___  Consent form signed  ___  Rho-Gam given by RN if indicated  ___ Medication dispensed   ___   Cytotec 800 mcg  __   Intravaginally by patient at home         __   Intravaginally by RN in MAU  __   Rectally by patient at home        __   Rectally by RN in MAU  ___  Ibuprofen 600 mg 1 tablet by mouth every 6 hours as needed #30  ___  Hydrocodone/acetaminophen 5/325 mg by mouth every 4 to 6 hours as needed  ___  Phenergan 12.5 mg by mouth every 4 hours as needed for nausea     MAU Course  Procedures  MDM Discussed inevitable Ab, options for expectant mgmt, D&C, medical mgmt with cytotec  Assessment and Plan  Patient accepts cytotec therapy, precautions given , f/u at GYN clinic 10-14 days  ARNOLD,JAMES 05/25/2011, 2:53 PM

## 2011-05-25 NOTE — ED Notes (Signed)
MD spoke with pt about dx per interpretor phones.

## 2011-05-25 NOTE — ED Provider Notes (Signed)
History     CSN: [id number]  Arrival date & time 05/25/11  0830   First MD Initiated Contact with Patient 05/25/11 [phone]      Chief Complaint  Patient presents with  . Vaginal Bleeding   30 year old female that is, gravida 6 para 5, with last normal menstrual period on September 29. Was in the ED last month and had an ultrasound done showing a positive intrauterine pregnancy, gestational age [redacted] weeks and 2 days at that time on November 21. She apparently also had an MRI done  on November 21, which showed no inflammatory changes in the gravid uterus. Patient noticed some blood when she was wiping yesterday. She has also had low back pain. Denies any fever or vomiting. Patient states [facility]. She has had some mild pain in her right lower abdomen.  Patient has had no dizziness or syncope.    (Consider location/radiation/quality/duration/timing/severity/associated sxs/prior treatment) HPI  History reviewed. No pertinent past medical history.  History reviewed. No pertinent past surgical history.  History reviewed. No pertinent family history.  History  Substance Use Topics  . Smoking status: Never Smoker   . Smokeless tobacco: Not on file  . Alcohol Use: No    OB History    Grav Para Term Preterm Abortions TAB SAB Ect Mult Living   1               Review of Systems  All other systems reviewed and are negative.    Allergies  Review of patient's allergies indicates no known allergies.  Home Medications  No current outpatient prescriptions on file.  BP 106/60  Pulse 73  Temp(Src) 98.3 F (36.8 C) (Oral)  Resp 18  Ht 5\' 3"  (1.6 m)  Wt 140 lb (63.504 kg)  BMI 24.80 kg/m2  SpO2 97%  LMP 02/23/2011  Physical Exam  Nursing note and vitals reviewed. Constitutional: She is oriented to person, place, and time. She appears well-developed and well-nourished. No distress.       Appears calm, and comfortable.  HENT:  Head:  Normocephalic and atraumatic.  Eyes: Conjunctivae and EOM are normal. Pupils are equal, round, and reactive to light.  Neck: Neck supple.  Cardiovascular: Normal rate and regular rhythm.  Exam reveals no gallop and no friction rub.   No murmur heard. Pulmonary/Chest: Breath sounds normal. She has no wheezes. She has no rales. She exhibits no tenderness.  Abdominal: Soft. Bowel sounds are normal. She exhibits no distension. There is no tenderness. There is no rebound and no guarding.  Genitourinary:       Chaperone present during entire exam. Cervical os was closed with moderate bleeding in the vaginal vault. No fetal tissue. Visualized. External genitalia were normal.  Musculoskeletal: Normal range of motion.  Neurological: She is alert and oriented to person, place, and time. No cranial nerve deficit. Coordination normal.  Skin: Skin is warm and dry. No rash noted.  Psychiatric: She has a normal mood and affect.    ED Course  Procedures (including critical care time)   Labs Reviewed  URINALYSIS, ROUTINE W REFLEX MICROSCOPIC  POCT PREGNANCY, URINE   No results found.   No diagnosis found.    MDM  Pt is seen and examined;  Initial history and physical completed.  Will follow.        Results for orders placed during the hospital encounter of 05/25/11  URINALYSIS, ROUTINE W REFLEX MICROSCOPIC      Component  Value Range   Color, Urine AMBER (*) YELLOW    APPearance CLOUDY (*) CLEAR    Specific Gravity, Urine 1.034 (*) 1.005 - 1.030    pH 6.0  5.0 - 8.0    Glucose, UA NEGATIVE  NEGATIVE (mg/dL)   Hgb urine dipstick LARGE (*) NEGATIVE    Bilirubin Urine SMALL (*) NEGATIVE    Ketones, ur NEGATIVE  NEGATIVE (mg/dL)   Protein, ur NEGATIVE  NEGATIVE (mg/dL)   Urobilinogen, UA 1.0  0.0 - 1.0 (mg/dL)   Nitrite NEGATIVE  NEGATIVE    Leukocytes, UA NEGATIVE  NEGATIVE   WET PREP, GENITAL      Component Value Range   Yeast, Wet Prep NONE SEEN  NONE SEEN    Trich, Wet Prep NONE  SEEN  NONE SEEN    Clue Cells, Wet Prep NONE SEEN  NONE SEEN    WBC, Wet Prep HPF POC FEW (*) NONE SEEN   POCT PREGNANCY, URINE      Component Value Range   Preg Test, Ur POSITIVE    URINE MICROSCOPIC-ADD ON      Component Value Range   Squamous Epithelial / LPF RARE  RARE    RBC / HPF 0-2  <3 (RBC/hpf)   Urine-Other MUCOUS PRESENT     No results found.  Results for orders placed during the hospital encounter of 05/25/11  URINALYSIS, ROUTINE W REFLEX MICROSCOPIC      Component Value Range   Color, Urine AMBER (*) YELLOW    APPearance CLOUDY (*) CLEAR    Specific Gravity, Urine 1.034 (*) 1.005 - 1.030    pH 6.0  5.0 - 8.0    Glucose, UA NEGATIVE  NEGATIVE (mg/dL)   Hgb urine dipstick LARGE (*) NEGATIVE    Bilirubin Urine SMALL (*) NEGATIVE    Ketones, ur NEGATIVE  NEGATIVE (mg/dL)   Protein, ur NEGATIVE  NEGATIVE (mg/dL)   Urobilinogen, UA 1.0  0.0 - 1.0 (mg/dL)   Nitrite NEGATIVE  NEGATIVE    Leukocytes, UA NEGATIVE  NEGATIVE   WET PREP, GENITAL      Component Value Range   Yeast, Wet Prep NONE SEEN  NONE SEEN    Trich, Wet Prep NONE SEEN  NONE SEEN    Clue Cells, Wet Prep NONE SEEN  NONE SEEN    WBC, Wet Prep HPF POC FEW (*) NONE SEEN   POCT PREGNANCY, URINE      Component Value Range   Preg Test, Ur POSITIVE    URINE MICROSCOPIC-ADD ON      Component Value Range   Squamous Epithelial / LPF RARE  RARE    RBC / HPF 0-2  <3 (RBC/hpf)   Urine-Other MUCOUS PRESENT     US Ob Comp Less 14 Wks  05/25/2011  *RADIOLOGY REPORT*  Clinical Data: Vaginal bleeding.  The estimated gestational age by LMP is 13 weeks 0 days.  OBSTETRIC <14 WK [place] AND TRANSVAGINAL OB US  Technique:  Both transabdominal and transvaginal ultrasound examinations were performed for complete evaluation of the gestation as well as the maternal uterus, adnexal regions, and pelvic cul-de-sac.  Transvaginal technique was performed to assess early pregnancy.  Comparison:  Obstetric ultrasound 04/17/2011   Intrauterine gestational sac:  Enlarged gestational sac is visualized Yolk sac: None Embryo: None  MSD: 45  mm  10w  0 d  Maternal uterus/adnexae: A corpus luteum noted in the left ovary.  Right ovary is normal. No free pelvic fluid.  IMPRESSION: Failed (anembryonic) first  trimester pregnancy.  (Previously visualized yolk sac on the study of 04/17/2011 is no longer present and no embryo is present on today's study.  We would expect to see an embryo at this time, given the appearances on the prior ultrasound of 04/17/2011, and gestational sac size.)  Original Report Authenticated By: Britta Mccreedy, M.D.   US Ob Transvaginal  05/25/2011  *RADIOLOGY REPORT*  Clinical Data: Vaginal bleeding.  The estimated gestational age by LMP is 13 weeks 0 days.  OBSTETRIC <14 WK [place] AND TRANSVAGINAL OB US  Technique:  Both transabdominal and transvaginal ultrasound examinations were performed for complete evaluation of the gestation as well as the maternal uterus, adnexal regions, and pelvic cul-de-sac.  Transvaginal technique was performed to assess early pregnancy.  Comparison:  Obstetric ultrasound 04/17/2011  Intrauterine gestational sac:  Enlarged gestational sac is visualized Yolk sac: None Embryo: None  MSD: 45  mm  10w  0 d  Maternal uterus/adnexae: A corpus luteum noted in the left ovary.  Right ovary is normal. No free pelvic fluid.  IMPRESSION: Failed (anembryonic) first trimester pregnancy.  (Previously visualized yolk sac on the study of 04/17/2011 is no longer present and no embryo is present on today's study.  We would expect to see an embryo at this time, given the appearances on the prior ultrasound of 04/17/2011, and gestational sac size.)  Original Report Authenticated By: Britta Mccreedy, M.D.    11:39 AM Will discuss ultrasound findings with the patient and will contact [facility] as patient may need early [facility] for definitive treatment   Discussed with patient and translator.  Will contact  [facility]   Pt transferred in stable condition     Tess Potts A. Patrica Duel, MD 05/25/11 1401

## 2011-05-25 NOTE — Progress Notes (Signed)
+  UPT 04/17/11, had abd pain then.  Seen in [facility] today, u/s shows 10 wk IUFD.

## 2011-05-25 NOTE — ED Notes (Signed)
Pt transferred to MAU per PTAR. Report called to MAU.

## 2011-05-27 LAB — GC/CHLAMYDIA PROBE AMP, GENITAL: GC Probe Amp, Genital: NEGATIVE

## 2011-06-17 ENCOUNTER — Encounter: Payer: Self-pay | Admitting: Advanced Practice Midwife

## 2011-06-17 ENCOUNTER — Ambulatory Visit (INDEPENDENT_AMBULATORY_CARE_PROVIDER_SITE_OTHER): Payer: Self-pay | Admitting: Advanced Practice Midwife

## 2011-06-17 ENCOUNTER — Emergency Department (HOSPITAL_COMMUNITY)
Admission: EM | Admit: 2011-06-17 | Discharge: 2011-06-17 | Disposition: A | Discharge status: Home / Self Care | Payer: Self-pay | Attending: Emergency Medicine | Admitting: Emergency Medicine

## 2011-06-17 ENCOUNTER — Encounter (HOSPITAL_COMMUNITY): Payer: Self-pay | Admitting: Family Medicine

## 2011-06-17 VITALS — BP 107/69 | HR 66 | Temp 98.0°F | Ht 63.5 in | Wt 143.4 lb

## 2011-06-17 DIAGNOSIS — O039 Complete or unspecified spontaneous abortion without complication: Secondary | ICD-10-CM

## 2011-06-17 DIAGNOSIS — G43909 Migraine, unspecified, not intractable, without status migrainosus: Secondary | ICD-10-CM | POA: Insufficient documentation

## 2011-06-17 DIAGNOSIS — R112 Nausea with vomiting, unspecified: Secondary | ICD-10-CM

## 2011-06-17 DIAGNOSIS — IMO0001 Reserved for inherently not codable concepts without codable children: Secondary | ICD-10-CM | POA: Insufficient documentation

## 2011-06-17 DIAGNOSIS — R42 Dizziness and giddiness: Secondary | ICD-10-CM | POA: Insufficient documentation

## 2011-06-17 HISTORY — DX: Complete or unspecified spontaneous abortion without complication: O03.9

## 2011-06-17 LAB — POCT URINALYSIS DIP (DEVICE)
Glucose, UA: NEGATIVE mg/dL
Specific Gravity, Urine: 1.02 (ref 1.005–1.030)

## 2011-06-17 LAB — URINE MICROSCOPIC-ADD ON

## 2011-06-17 LAB — URINALYSIS, ROUTINE W REFLEX MICROSCOPIC
Glucose, UA: NEGATIVE mg/dL
Specific Gravity, Urine: 1.035 — ABNORMAL HIGH (ref 1.005–1.030)
Urobilinogen, UA: 1 mg/dL (ref 0.0–1.0)
pH: 6.5 (ref 5.0–8.0)

## 2011-06-17 LAB — POCT PREGNANCY, URINE: Preg Test, Ur: NEGATIVE

## 2011-06-17 MED ORDER — SODIUM CHLORIDE 0.9 % IV BOLUS (SEPSIS)
1000.0000 mL | Freq: Once | INTRAVENOUS | Status: AC
  Administered 2011-06-17: 1000 mL via INTRAVENOUS

## 2011-06-17 MED ORDER — SODIUM CHLORIDE 0.9 % IV SOLN
INTRAVENOUS | Status: DC
  Administered 2011-06-17: 16:00:00 via INTRAVENOUS

## 2011-06-17 MED ORDER — KETOROLAC TROMETHAMINE 30 MG/ML IJ SOLN
30.0000 mg | Freq: Once | INTRAMUSCULAR | Status: AC
  Administered 2011-06-17: 30 mg via INTRAVENOUS
  Filled 2011-06-17: qty 1

## 2011-06-17 MED ORDER — PROMETHAZINE HCL 25 MG RE SUPP: RECTAL | Status: DC

## 2011-06-17 MED ORDER — DIPHENHYDRAMINE HCL 50 MG/ML IJ SOLN
50.0000 mg | Freq: Once | INTRAMUSCULAR | Status: AC
  Administered 2011-06-17: 50 mg via INTRAVENOUS
  Filled 2011-06-17: qty 1

## 2011-06-17 MED ORDER — METOCLOPRAMIDE HCL 5 MG/ML IJ SOLN
10.0000 mg | Freq: Once | INTRAMUSCULAR | Status: AC
  Administered 2011-06-17: 10 mg via INTRAVENOUS
  Filled 2011-06-17: qty 2

## 2011-06-17 NOTE — ED Provider Notes (Signed)
History     CSN: [id number]  Arrival date & time 06/17/11  1419   First MD Initiated Contact with Patient 06/17/11 1456    Level V caveat because of language barrier  Chief Complaint  Patient presents with  . Headache  . Abdominal Pain  . Nausea    (Consider location/radiation/quality/duration/timing/severity/associated sxs/prior treatment) HPI  History obtained through language line interpreter  Patient relates yesterday she started getting a diffuse headache and not feeling well. She states the headache is: Cranial and is throbbing and makes her feel dizzy. She states dizziness is worse when she stands up it makes her feel like she is going to throw up patient is unable to describe her headache other than stating she feels like her head is going to fall off. Patient states that she has photophobia and noise sensitivity and some blurred vision. She has some myalgias but she denies rhinorrhea, sore throat, cough, or fever. She states she has had headaches of her pregnancy but she's not pregnant now. She denies anything different in her activities recently. She denies using gas or care seen for heat and states they use electric heat the house. Patient had a miscarriage on December 29 and had an appointment actually earlier today at [facility] clinic and they state that she is doing well after her miscarriage. They however did not treat her headache.  PCP none  Past Medical History  Diagnosis Date  . Chlamydia   . Miscarriage 12.29.12     Past Surgical History  Procedure Date  . No past surgeries     History reviewed. No pertinent family history.  History  Substance Use Topics  . Smoking status: Never Smoker   . Smokeless tobacco: Never Used  . Alcohol Use: No    OB History    Grav Para Term Preterm Abortions TAB SAB Ect Mult Living   6 5 5       5       Review of Systems  Unable to perform ROS: Other    Allergies  Review of patient's allergies indicates no known  allergies.  Home Medications   Current Outpatient Rx  Name Route Sig Dispense Refill  . ACETAMINOPHEN 325 MG PO TABS Oral Take 650 mg by mouth every 4 (four) hours as needed. For pain      BP 108/65  Pulse 62  Temp(Src) 99.2 F (37.3 C) (Oral)  Resp 18  SpO2 100%  LMP 02/23/2011  Vital signs normal    Physical Exam  Nursing note and vitals reviewed. Constitutional: She is oriented to person, place, and time. She appears well-developed and well-nourished.  Non-toxic appearance. She does not appear ill. No distress.  HENT:  Head: Normocephalic and atraumatic.  Right Ear: External ear normal.  Left Ear: External ear normal.  Nose: Nose normal. No mucosal edema or rhinorrhea.  Mouth/Throat: Mucous membranes are normal. No dental abscesses or uvula swelling.       Mucous membranes dry  Eyes: Conjunctivae and EOM are normal. Pupils are equal, round, and reactive to light.  Neck: Normal range of motion and full passive range of motion without pain. Neck supple.       She has diffuse tenderness of the paraspinous muscles but no nuchal rigidity  Cardiovascular: Normal rate, regular rhythm and normal heart sounds.  Exam reveals no gallop and no friction rub.   No murmur heard. Pulmonary/Chest: Effort normal and breath sounds normal. No respiratory distress. She has no wheezes. She has no rhonchi.  She has no rales. She exhibits no tenderness and no crepitus.  Abdominal: Soft. Normal appearance and bowel sounds are normal. She exhibits no distension. There is no tenderness. There is no rebound and no guarding.  Musculoskeletal: Normal range of motion. She exhibits no edema and no tenderness.       Moves all extremities well.   Neurological: She is alert and oriented to person, place, and time. She has normal strength. No cranial nerve deficit.  Skin: Skin is warm, dry and intact. No rash noted. No erythema. No pallor.  Psychiatric: Her speech is normal and behavior is normal. Her mood  appears not anxious.       Affect is flat    ED Course  Procedures (including critical care time)   Medications  0.9 %  sodium chloride infusion (  Intravenous New Bag/Given 06/17/11 1616)  sodium chloride 0.9 % bolus 1,000 mL (1000 mL Intravenous Given 06/17/11 1616)  metoCLOPramide (REGLAN) injection 10 mg (10 mg Intravenous Given 06/17/11 1616)  diphenhydrAMINE (BENADRYL) injection 50 mg (50 mg Intravenous Given 06/17/11 1616)  ketorolac (TORADOL) 30 MG/ML injection 30 mg (30 mg Intravenous Given 06/17/11 1618)   Recheck at 1755 states she feels better, her headache is gone  Results for orders placed during the hospital encounter of 06/17/11  URINALYSIS, ROUTINE W REFLEX MICROSCOPIC      Component Value Range   Color, Urine AMBER (*) YELLOW    APPearance CLOUDY (*) CLEAR    Specific Gravity, Urine 1.035 (*) 1.005 - 1.030    pH 6.5  5.0 - 8.0    Glucose, UA NEGATIVE  NEGATIVE (mg/dL)   Hgb urine dipstick SMALL (*) NEGATIVE    Bilirubin Urine SMALL (*) NEGATIVE    Ketones, ur TRACE (*) NEGATIVE (mg/dL)   Protein, ur NEGATIVE  NEGATIVE (mg/dL)   Urobilinogen, UA 1.0  0.0 - 1.0 (mg/dL)   Nitrite NEGATIVE  NEGATIVE    Leukocytes, UA TRACE (*) NEGATIVE   POCT PREGNANCY, URINE      Component Value Range   Preg Test, Ur NEGATIVE    URINE MICROSCOPIC-ADD ON      Component Value Range   Squamous Epithelial / LPF MANY (*) RARE    WBC, UA 0-2  <3 (WBC/hpf)   RBC / HPF 0-2  <3 (RBC/hpf)   Bacteria, UA FEW (*) RARE    Urine-Other MUCOUS PRESENT      Laboratory interpretation all normal    Diagnoses that have been ruled out:  None  Diagnoses that are still under consideration:  None  Final diagnoses:  Migraine headache   Patient's Medications  New Prescriptions   PROMETHAZINE (PHENERGAN) 25 MG SUPPOSITORY    Unwrap and insert 1 PR PRN nausea, vomiting or headache    Plan discharge  Devoria Albe, MD, Armando Gang    MDM          Ward Givens, MD 06/17/11 [id number]

## 2011-06-17 NOTE — Patient Instructions (Signed)
Follow up as desired for birth control. You should go to [facility] now for evaluation of your other symptoms.

## 2011-06-17 NOTE — Progress Notes (Addendum)
  Subjective:    Patient ID: Autumn Benton, female    DOB: 04-Feb-1981, 31 y.o.   MRN: [id number]  HPI Patient is a 31 y/o [id number] female who presents for follow-up from the MAU for SAB 2 weeks ago.  She has abdominal pain in the RLQ and LLQ that she rates as 4/10 currently, and 8/10 at its worst.  Tylenol does not relieve this pain.  She denies vaginal bleeding, discharge, painful urination, fever, chills, and diarrhea.  For the past 1 day she has had a headache, blurry vision, dizziness, and vomited all of her meals.  She has kept water down.  Tylenol has provided no relief of these symptoms.      Review of Systems  Constitutional: Positive for fatigue. Negative for fever and chills.  HENT: Negative for congestion and rhinorrhea.   Eyes: Positive for visual disturbance.  Respiratory: Negative for shortness of breath.   Cardiovascular: Negative for chest pain and leg swelling.  Genitourinary: Negative for dysuria, hematuria, vaginal bleeding, vaginal discharge and menstrual problem.  Neurological: Positive for headaches (x1 day).       Objective:   Physical Exam  Constitutional: She is oriented to person, place, and time. She appears well-developed. She appears distressed (mild distress).  HENT:  Head: Normocephalic and atraumatic.  Eyes: Pupils are equal, round, and reactive to light.  Cardiovascular: Normal heart sounds.   Pulmonary/Chest: Effort normal and breath sounds normal. No respiratory distress. She has no wheezes.  Abdominal: She exhibits no mass. There is tenderness (mild-moderate RLQ). There is no rebound.  Lymphadenopathy:    She has no cervical adenopathy.  Neurological: She is alert and oriented to person, place, and time.  Psychiatric: She has a normal mood and affect. Her behavior is normal.          Assessment & Plan:  Assessment 1) SAB resolved 2) Dizziness, vomiting, headache  Plan 1) UPT negative and no vaginal bleeding.  Discussed  contraception.  Patient is considering  IUD but would like to think it over.     2) Recommended patient visit [facility] ED for evaluation of dizziness, vomiting, and headache.    Addendum: 1) Pt was given Cytotec in MAU for missed AB, had vaginal bleeding following, no bleeding x 2 weeks. SAB complete.  2) Pt desires birth control - does not want depo provera or OCPs, briefly discussed implanon and nuva ring. Pt states she wants to think about it and will schedule appt when decided.  3) Pt to [facility] now for vomiting/headache/dizziness - not related to SAB.

## 2011-06-17 NOTE — ED Notes (Signed)
Pt reports having a miscarriage on 05/25/11.  States she hasn't had any bleeding since.

## 2011-06-17 NOTE — ED Notes (Signed)
Pt reports having headache and N/V x2 days states she was seen at [facility] clinic and received a negative pregnancy test.

## 2011-06-17 NOTE — ED Notes (Signed)
Pt given discharge instructions and verb understanding, amb indep

## 2011-07-01 ENCOUNTER — Ambulatory Visit: Payer: Self-pay | Admitting: Family Medicine

## 2011-07-11 ENCOUNTER — Ambulatory Visit: Payer: Self-pay | Admitting: Family Medicine

## 2012-05-27 NOTE — L&D Delivery Note (Signed)
Delivery Note At 3:15 PM a viable female was delivered via Vaginal, Spontaneous Delivery (Presentation: Right Occiput Anterior).  APGAR: 9, 9; weight 5lb 0oz.   Placenta status: Intact, Spontaneous.  Cord: 3 vessels with the following complications: None.    Anesthesia: Epidural  Episiotomy: None Lacerations: None Suture Repair: na Est. Blood Loss (mL): 200  Mom to postpartum.  Baby to Couplet care / Skin to Skin.  Pt pushed with good maternal effort and no epidural to deliver a liveborn female. Baby immediately placed skin to skin with Mom. Cord cut and clamped after it stopped pulsating.  Placenta delivered spontaneously intact with 3V cord. Small baby without reason so will send placenta to pathology for IUGR. No tears or complications. Slightly boggy uterus despite pitocin so placed cytotec 1000mg  PR prophylactically. Mom and baby doing well.   Madolyn Ackroyd L 04/07/2013, 4:11 PM

## 2012-10-01 ENCOUNTER — Emergency Department (HOSPITAL_COMMUNITY): Payer: Self-pay

## 2012-10-01 ENCOUNTER — Encounter (HOSPITAL_COMMUNITY): Payer: Self-pay | Admitting: Emergency Medicine

## 2012-10-01 ENCOUNTER — Emergency Department (HOSPITAL_COMMUNITY)
Admission: EM | Admit: 2012-10-01 | Discharge: 2012-10-02 | Disposition: A | Discharge status: Home / Self Care | Payer: Self-pay | Attending: Emergency Medicine | Admitting: Emergency Medicine

## 2012-10-01 DIAGNOSIS — R52 Pain, unspecified: Secondary | ICD-10-CM | POA: Insufficient documentation

## 2012-10-01 DIAGNOSIS — R5381 Other malaise: Secondary | ICD-10-CM | POA: Insufficient documentation

## 2012-10-01 DIAGNOSIS — R109 Unspecified abdominal pain: Secondary | ICD-10-CM | POA: Insufficient documentation

## 2012-10-01 DIAGNOSIS — O2390 Unspecified genitourinary tract infection in pregnancy, unspecified trimester: Secondary | ICD-10-CM | POA: Insufficient documentation

## 2012-10-01 DIAGNOSIS — M545 Low back pain, unspecified: Secondary | ICD-10-CM | POA: Insufficient documentation

## 2012-10-01 DIAGNOSIS — O9989 Other specified diseases and conditions complicating pregnancy, childbirth and the puerperium: Secondary | ICD-10-CM | POA: Insufficient documentation

## 2012-10-01 DIAGNOSIS — N949 Unspecified condition associated with female genital organs and menstrual cycle: Secondary | ICD-10-CM | POA: Insufficient documentation

## 2012-10-01 DIAGNOSIS — N72 Inflammatory disease of cervix uteri: Secondary | ICD-10-CM | POA: Insufficient documentation

## 2012-10-01 DIAGNOSIS — Z3490 Encounter for supervision of normal pregnancy, unspecified, unspecified trimester: Secondary | ICD-10-CM

## 2012-10-01 DIAGNOSIS — R509 Fever, unspecified: Secondary | ICD-10-CM | POA: Insufficient documentation

## 2012-10-01 DIAGNOSIS — Z8619 Personal history of other infectious and parasitic diseases: Secondary | ICD-10-CM | POA: Insufficient documentation

## 2012-10-01 LAB — CBC WITH DIFFERENTIAL/PLATELET
Eosinophils Absolute: 0.2 10*3/uL (ref 0.0–0.7)
Eosinophils Relative: 3 % (ref 0–5)
Lymphs Abs: 2.3 10*3/uL (ref 0.7–4.0)
MCH: 31.3 pg (ref 26.0–34.0)
MCV: 86.7 fL (ref 78.0–100.0)
Monocytes Relative: 6 % (ref 3–12)
Platelets: 185 10*3/uL (ref 150–400)
RBC: 4.57 MIL/uL (ref 3.87–5.11)

## 2012-10-01 LAB — WET PREP, GENITAL

## 2012-10-01 LAB — URINE MICROSCOPIC-ADD ON

## 2012-10-01 LAB — URINALYSIS, ROUTINE W REFLEX MICROSCOPIC
Hgb urine dipstick: NEGATIVE
Specific Gravity, Urine: 1.036 — ABNORMAL HIGH (ref 1.005–1.030)
Urobilinogen, UA: 1 mg/dL (ref 0.0–1.0)

## 2012-10-01 LAB — POCT PREGNANCY, URINE: Preg Test, Ur: POSITIVE — AB

## 2012-10-01 NOTE — ED Provider Notes (Signed)
History     CSN: [id number]  Arrival date & time 10/01/12  1614   First MD Initiated Contact with Patient 10/01/12 2008      Chief Complaint  Patient presents with  . Generalized Body Aches    (Consider location/radiation/quality/duration/timing/severity/associated sxs/prior treatment) HPI Autumn Benton is a 32 y.o. female who presents to ED with complaint of body aches, lower abdominal pain, weakness. States she is about [redacted]wks pregnant. States no URI symptoms. No urinary symptoms. No n/v/d. States last time had similar symptoms she miscarried. States was about a year ago. Denies taking any medications. Denies measuring temp at home but feels like she may have had a fever. Denies vagianl discharge or bleeding. States pain in lower abdomen and lower back, feels crampy. Nothing makes pain better or worse. Interpreter phones used, pt spanish speaking only.    Past Medical History  Diagnosis Date  . Chlamydia   . Miscarriage     Past Surgical History  Procedure Laterality Date  . No past surgeries      History reviewed. No pertinent family history.  History  Substance Use Topics  . Smoking status: Never Smoker   . Smokeless tobacco: Never Used  . Alcohol Use: No    OB History   Grav Para Term Preterm Abortions TAB SAB Ect Mult Living   6 5 5       5       Review of Systems  Constitutional: Positive for fever, chills and fatigue.  HENT: Negative for neck pain and neck stiffness.   Respiratory: Negative.   Cardiovascular: Negative.   Gastrointestinal: Positive for abdominal pain. Negative for nausea, vomiting and diarrhea.  Genitourinary: Positive for pelvic pain. Negative for dysuria, urgency, flank pain, vaginal bleeding, vaginal discharge and vaginal pain.  Musculoskeletal: Positive for back pain.  All other systems reviewed and are negative.    Allergies  Review of patient's allergies indicates no known allergies.  Home Medications  No current  outpatient prescriptions on file.  BP 108/75  Pulse 91  Temp(Src) 98.8 F (37.1 C) (Oral)  Resp 18  Ht 5\' 3"  (1.6 m)  Wt 155 lb (70.308 kg)  BMI 27.46 kg/m2  SpO2 99%  Physical Exam  Nursing note and vitals reviewed. Constitutional: She appears well-developed and well-nourished. No distress.  HENT:  Head: Normocephalic and atraumatic.  Right Ear: External ear normal.  Left Ear: External ear normal.  Mouth/Throat: Oropharynx is clear and moist.  Neck: Neck supple.  Cardiovascular: Normal rate, regular rhythm and normal heart sounds.   Pulmonary/Chest: Effort normal and breath sounds normal. No respiratory distress. She has no wheezes. She has no rales.  Abdominal: Soft. Bowel sounds are normal. She exhibits no distension. There is tenderness. There is no rebound and no guarding.  Diffuse tenderness  Genitourinary: Vagina normal. Uterus is enlarged and tender. Cervix exhibits motion tenderness. Cervix exhibits no discharge. Right adnexum displays tenderness. Left adnexum displays tenderness.  Skin: Skin is warm and dry.    ED Course  Procedures (including critical care time)  Pt with abdominal pain, chills, body aches. About [redacted]wks pregnant by Last period. Similar symptoms when miscarried in the past. No vaginal discharge or bleeding. Will get labs, her blood type is O pos from prior blood work here. Will get [place] for further evaluation of pregnancy and to r/o ectopic. ua pending. VS normal currently.   Filed Vitals:   10/01/12 1633  BP: 108/75  Pulse: 91  Temp: 98.8 F (37.1 C)  Resp: 18    Results for orders placed during the hospital encounter of 10/01/12  WET PREP, GENITAL      Result Value Range   Yeast Wet Prep HPF POC NONE SEEN  NONE SEEN   Trich, Wet Prep NONE SEEN  NONE SEEN   Clue Cells Wet Prep HPF POC FEW (*) NONE SEEN   WBC, Wet Prep HPF POC TOO NUMEROUS TO COUNT (*) NONE SEEN  URINALYSIS, ROUTINE W REFLEX MICROSCOPIC      Result Value Range   Color, Urine  AMBER (*) YELLOW   APPearance CLEAR  CLEAR   Specific Gravity, Urine 1.036 (*) 1.005 - 1.030   pH 5.0  5.0 - 8.0   Glucose, UA NEGATIVE  NEGATIVE mg/dL   Hgb urine dipstick NEGATIVE  NEGATIVE   Bilirubin Urine SMALL (*) NEGATIVE   Ketones, ur 40 (*) NEGATIVE mg/dL   Protein, ur NEGATIVE  NEGATIVE mg/dL   Urobilinogen, UA 1.0  0.0 - 1.0 mg/dL   Nitrite NEGATIVE  NEGATIVE   Leukocytes, UA SMALL (*) NEGATIVE  CBC WITH DIFFERENTIAL      Result Value Range   WBC 9.0  4.0 - 10.5 K/uL   RBC 4.57  3.87 - 5.11 MIL/uL   Hemoglobin 14.3  12.0 - 15.0 g/dL   HCT 14.7  82.9 - 56.2 %   MCV 86.7  78.0 - 100.0 fL   MCH 31.3  26.0 - 34.0 pg   MCHC 36.1 (*) 30.0 - 36.0 g/dL   RDW 13.0  86.5 - 78.4 %   Platelets 185  150 - 400 K/uL   Neutrophils Relative 65  43 - 77 %   Neutro Abs 5.9  1.7 - 7.7 K/uL   Lymphocytes Relative 26  12 - 46 %   Lymphs Abs 2.3  0.7 - 4.0 K/uL   Monocytes Relative 6  3 - 12 %   Monocytes Absolute 0.6  0.1 - 1.0 K/uL   Eosinophils Relative 3  0 - 5 %   Eosinophils Absolute 0.2  0.0 - 0.7 K/uL   Basophils Relative 0  0 - 1 %   Basophils Absolute 0.0  0.0 - 0.1 K/uL  HCG, QUANTITATIVE, PREGNANCY      Result Value Range   hCG, Beta Chain, Quant, S 73701 (*) <5 mIU/mL  COMPREHENSIVE METABOLIC PANEL      Result Value Range   Sodium 136  135 - 145 mEq/L   Potassium 3.4 (*) 3.5 - 5.1 mEq/L   Chloride 102  96 - 112 mEq/L   CO2 23  19 - 32 mEq/L   Glucose, Bld 78  70 - 99 mg/dL   BUN 8  6 - 23 mg/dL   Creatinine, Ser 6.96  0.50 - 1.10 mg/dL   Calcium 9.4  8.4 - 29.5 mg/dL   Total Protein 7.2  6.0 - 8.3 g/dL   Albumin 3.6  3.5 - 5.2 g/dL   AST 16  0 - 37 U/L   ALT 20  0 - 35 U/L   Alkaline Phosphatase 47  39 - 117 U/L   Total Bilirubin 0.9  0.3 - 1.2 mg/dL   GFR calc non Af Amer >90  >90 mL/min   GFR calc Af Amer >90  >90 mL/min  LIPASE, BLOOD      Result Value Range   Lipase 32  11 - 59 U/L  URINE MICROSCOPIC-ADD ON      Result Value Range   Squamous Epithelial  /  LPF FEW (*) RARE   WBC, UA 3-6  <3 WBC/hpf   RBC / HPF 0-2  <3 RBC/hpf   Bacteria, UA RARE  RARE   Urine-Other MUCOUS PRESENT    POCT PREGNANCY, URINE      Result Value Range   Preg Test, Ur POSITIVE (*) NEGATIVE   US Ob Comp Less 14 Wks  10/01/2012  *RADIOLOGY REPORT*  Clinical Data: Abdominal pain  OBSTETRIC <14 WK ULTRASOUND  Technique:  Transabdominal ultrasound was performed for evaluation of the gestation as well as the maternal uterus and adnexal regions.  Comparison:  None.  Intrauterine gestational sac: Single Yolk sac: Not identified Embryo: Present Cardiac Activity: Present Heart Rate: 160 bpm  CRL:  40.4 mm  10 w  6 d      [place] EDC: 04/15/2013  Maternal uterus/Adnexae: Normal ovaries and uterus.  Small subchorionic hemorrhage.  IMPRESSION:  1.  Single intrauterine gestation with embryo and normal cardiac activity. 2.  Estimate gestational age by crown-rump length equals 10 weeks 6 days.   Original Report Authenticated By: Genevive Bi, M.D.      1. Pregnancy   2. Cervicitis       MDM  Pt with generalized body aches, abdominal pain. Labs negative. Her UA is clear. Pelvic exam did have postive CMT, TNTC WBCs on wet prep. Pt treated for cervicitis in ED with rocephin 250mg  IM and zithromax 1g PO. US obtained to r/o ectopic pregnancy given pt's abdominal pain and it showed a single intrauterine embryo wit nromal cardiac activity at [redacted]w[redacted]d.  Discussed results with pt. Plan: prenatal vitamins, tylenol for pain, home with GYN follow up. Interpreter used to discussed results and the plan. Pt voiced understanding, all questions answered.   Filed Vitals:   10/01/12 1633 10/01/12 2239  BP: 108/75 107/73  Pulse: 91 78  Temp: 98.8 F (37.1 C) 97.6 F (36.4 C)  TempSrc: Oral Oral  Resp: 18 16  Height: 5\' 3"  (1.6 m)   Weight: 155 lb (70.308 kg)   SpO2: 99% 98%           Lottie Mussel, PA-C 10/02/12 1308

## 2012-10-01 NOTE — ED Notes (Signed)
Patient transported to Ultrasound 

## 2012-10-01 NOTE — ED Notes (Signed)
Pt returned from US

## 2012-10-01 NOTE — ED Notes (Signed)
Pt c/o generalized body aches x 1 week; pt sts [redacted] weeks pregnant; pt denies vaginal bleeding or discharge; pt sts possible fever

## 2012-10-01 NOTE — ED Notes (Signed)
Pt states she speaks no english. Interpreter phones needed

## 2012-10-02 LAB — HCG, QUANTITATIVE, PREGNANCY: hCG, Beta Chain, Quant, S: 73701 m[IU]/mL — ABNORMAL HIGH (ref <5)

## 2012-10-02 LAB — COMPREHENSIVE METABOLIC PANEL
ALT: 20 U/L (ref 0–35)
AST: 16 U/L (ref 0–37)
Albumin: 3.6 g/dL (ref 3.5–5.2)
Alkaline Phosphatase: 47 U/L (ref 39–117)
Potassium: 3.4 mEq/L — ABNORMAL LOW (ref 3.5–5.1)
Sodium: 136 mEq/L (ref 135–145)
Total Protein: 7.2 g/dL (ref 6.0–8.3)

## 2012-10-02 LAB — GC/CHLAMYDIA PROBE AMP: CT Probe RNA: NEGATIVE

## 2012-10-02 MED ORDER — AZITHROMYCIN 250 MG PO TABS
1000.0000 mg | ORAL_TABLET | Freq: Once | ORAL | Status: AC
  Administered 2012-10-02: 1000 mg via ORAL
  Filled 2012-10-02: qty 4

## 2012-10-02 MED ORDER — CEFTRIAXONE SODIUM 250 MG IJ SOLR
250.0000 mg | Freq: Once | INTRAMUSCULAR | Status: AC
  Administered 2012-10-02: 250 mg via INTRAMUSCULAR
  Filled 2012-10-02: qty 250

## 2012-10-02 NOTE — Discharge Instructions (Signed)
Your ultrasound showed a normal pregnancy at 10w and 6d. Start prenatal vitamins. Tylenol for pain. You were also treated today for vaginal infection. Follow up with GYN doctor. Go to [facility] hospital if any problems.    Psychiatrist (Pregnancy) Si planea quedar embarazada, es una buena idea concertar una cita de preconcepcin con el mdico para poder lograr un estilo de vida saludable ante de quedar embarazada. Esto incluye dieta, peso, ejercicio, el tomar vitaminas prenatales en especial cido flico (ayuda a prevenir defectos en el cerebro y la mdula espinal), evitar el alcohol, fumar, las drogas ilegales, problemas mdicos (diabetes, convulsiones), historial familiar de problemas genticos, condiciones de trabajo e inmunizaciones. Es mejor tener conocimiento de estas cosas y Radio producer algo antes de quedar embarazada. Si est embarazada, es necesario que siga ciertas pautas para tener un beb sano. Es muy importante Education officer, environmental controles prenatales adecuados y seguir las indicaciones del profesional que la asiste. La atencin prenatal incluye toda la asistencia mdica que usted recibe antes del nacimiento del beb. Esto ayuda a Publishing copy y Barnum. INSTRUCCIONES PARA EL CUIDADO DOMICILIARIO  Comience las consultas prenatales alrededor de la 12 semana de embarazo o lo antes posible. Al principio generalmente se programan cada mes. Se hacen ms frecuentes en los 2 ltimos meses antes del parto. Es importante que concurra a todas las citas con el profesional y siga sus instrucciones con Camera operator a los medicamentos que deba Chemical engineer, a la actividad fsica y a Psychologist, forensic.  Durante el embarazo debe obtener nutrientes para usted y para su beb. Consuma una dieta normal y bien balanceada. Elija alimentos como carne, pescado, [place] y otros productos lcteos, vegetales, frutas, panes integrales y Research officer, trade union cul es el aumento de [place] ideal, segn su peso y Biochemist, clinical. Beba gran cantidad de lquidos. Trate de beber 8 vasos de lquidos por Futures trader.  El alcohol se asocia a cierto nmero de defectos del nacimiento, incluyendo el sndrome de alcoholismo fetal. Lo mejor es evitarlo completamente El cigarrillo causa nacimientos prematuros y bebs de bajo peso al Psychologist, clinical. El consumo de alcohol y nicotina durante el embarazo tambin aumentan marcadamente la probabilidad de que el nio sea qumicamente dependiente en etapas posteriores de su vida y puede contribuir al sndrome de muerte sbita infantil (SMSI)  No consuma drogas.  Solo tome medicamentos prescriptos o de venta libre que le haya recomendado el profesional. Algunos medicamentos pueden causar problemas genticos y fsicos al beb  Las nuseas matinales pueden aliviarse si come Chiropodist en la cama. Coma dos galletitas antes de levantarse por la maana.  Las relaciones sexuales pueden continuarse hasta casi el final del embarazo, si no se presentan otros problemas como prdida prematura (antes de tiempo) de lquido amnitico, Restaurant manager, fast food vaginal, dolor durante las relaciones sexuales o dolor abdominal (en el vientre).  Practique ejercicios con regularidad. Consulte con el profesional que la asiste si no sabe con certeza si determinados ejercicios son seguros.  No utilice la baera con agua caliente, baos turcos y saunas. Estos aumentan el riesgo de sufrir un desmayo o de prdida del conocimiento, y as Runner, broadcasting/film/video usted o el beb. La natacin es un buen ejercicio. Descanse todo lo que pueda e incluya una siesta despus de almorzar siempre que le sea posible, especialmente durante el tercer trimestre.  Evite los olores y las sustancias qumicas txicas.  No use zapatos de tacones altos, podra perder el equilibrio y caer.  No levante objetos de ms de 2,5  kg. Si levanta un objeto, flexione las piernas y los muslos, y no la espalda.  Evite los viajes largos, Paramedic  trimestre.  Si debe viajar fuera de la ciudad o de su estado, lleve una copia de la historia clnica. SOLICITE ATENCIN MDICA DE INMEDIATO SI:  La temperatura oral se eleva sin motivo por encima de 102 F (38.9 C) o segn le indique el profesional que lo asiste.  Tiene una prdida de lquido por la vagina. Si sospecha una ruptura de las [place], tmese la temperatura y llame al profesional para informarlo sobre esto.  Observa unas pequeas manchas o una hemorragia vaginal Notifique al profesional acerca de la cantidad y de cuntos apsitos est utilizando.  Contina teniendo nuseas y no obtiene alivio de los [employer] han [place], o vomita sangre o una sustancia similar a la borra del caf.  Presenta un dolor en la zona superior del abdomen.  Siente molestias en el ligamento redondo en la parte abdominal baja. El profesional que la asiste Hydrologist.  Siente pequeas contracciones del tero (matriz)  No siente que el beb se mueve, o percibe menos movimientos que antes.  Siente dolor al [employer].  Brett Fairy hemorragia vaginal anormal.  Tiene diarrea persistente.  Sufre una cefalea grave.  Tiene problemas visuales.  Comienza a sentir debilidad muscular.  Se siente mareada o sufre un desmayo.  Comienza a sentir falta de aire.  Siente dolor en el pecho.  Sufre dolor en la espalda que se irradia hacia la pierna y el pie.  Siente latidos cardacos irregulares o la frecuencia cardaca es muy rpida.  Aumenta excesivamente de peso en un perodo breve (2,5 kg en 3 a 5 das)  Se ve envuelta en una situacin de violencia domstica. Document Released: 02/20/2005 Document Revised: 08/05/2011 [facility] Patient Information 2013 [place], [place].

## 2012-10-03 NOTE — ED Provider Notes (Signed)
Medical screening examination/treatment/procedure(s) were performed by non-physician practitioner and as supervising physician I was immediately available for consultation/collaboration.   Gwyneth Sprout, MD 10/03/12 [phone]

## 2012-12-14 ENCOUNTER — Other Ambulatory Visit (HOSPITAL_COMMUNITY): Payer: Self-pay | Admitting: Physician Assistant

## 2012-12-14 DIAGNOSIS — Z0489 Encounter for examination and observation for other specified reasons: Secondary | ICD-10-CM

## 2012-12-14 LAB — OB RESULTS CONSOLE ABO/RH: RH Type: POSITIVE

## 2012-12-14 LAB — OB RESULTS CONSOLE HEPATITIS B SURFACE ANTIGEN: Hepatitis B Surface Ag: NEGATIVE

## 2012-12-16 ENCOUNTER — Encounter (HOSPITAL_COMMUNITY): Payer: Self-pay

## 2012-12-16 ENCOUNTER — Ambulatory Visit (HOSPITAL_COMMUNITY)
Admission: RE | Admit: 2012-12-16 | Discharge: 2012-12-16 | Disposition: A | Discharge status: Home / Self Care | Payer: Self-pay | Source: Ambulatory Visit | Attending: Physician Assistant | Admitting: Physician Assistant

## 2012-12-16 DIAGNOSIS — Z0489 Encounter for examination and observation for other specified reasons: Secondary | ICD-10-CM

## 2012-12-16 DIAGNOSIS — Z1389 Encounter for screening for other disorder: Secondary | ICD-10-CM | POA: Insufficient documentation

## 2012-12-16 DIAGNOSIS — Z363 Encounter for antenatal screening for malformations: Secondary | ICD-10-CM | POA: Insufficient documentation

## 2013-03-29 LAB — OB RESULTS CONSOLE GBS: GBS: POSITIVE

## 2013-04-07 ENCOUNTER — Encounter (HOSPITAL_COMMUNITY): Payer: Self-pay | Admitting: *Deleted

## 2013-04-07 ENCOUNTER — Inpatient Hospital Stay (HOSPITAL_COMMUNITY)
Admission: AD | Admit: 2013-04-07 | Discharge: 2013-04-09 | DRG: 775 | Disposition: A | Discharge status: Home / Self Care | Payer: Medicaid Other | Source: Ambulatory Visit | Attending: Obstetrics & Gynecology | Admitting: Obstetrics & Gynecology

## 2013-04-07 DIAGNOSIS — O4290 Premature rupture of membranes, unspecified as to length of time between rupture and onset of labor, unspecified weeks of gestation: Principal | ICD-10-CM | POA: Diagnosis present

## 2013-04-07 DIAGNOSIS — O365990 Maternal care for other known or suspected poor fetal growth, unspecified trimester, not applicable or unspecified: Secondary | ICD-10-CM

## 2013-04-07 DIAGNOSIS — O9989 Other specified diseases and conditions complicating pregnancy, childbirth and the puerperium: Secondary | ICD-10-CM

## 2013-04-07 DIAGNOSIS — O99892 Other specified diseases and conditions complicating childbirth: Secondary | ICD-10-CM | POA: Diagnosis present

## 2013-04-07 DIAGNOSIS — Z22330 Carrier of Group B streptococcus: Secondary | ICD-10-CM

## 2013-04-07 LAB — TYPE AND SCREEN: Antibody Screen: NEGATIVE

## 2013-04-07 LAB — RPR: RPR Ser Ql: NONREACTIVE

## 2013-04-07 LAB — CBC
Hemoglobin: 13 g/dL (ref 12.0–15.0)
MCV: 88.1 fL (ref 78.0–100.0)
Platelets: 159 10*3/uL (ref 150–400)
RBC: 4.11 MIL/uL (ref 3.87–5.11)
WBC: 7.9 10*3/uL (ref 4.0–10.5)

## 2013-04-07 LAB — ABO/RH: ABO/RH(D): O POS

## 2013-04-07 MED ORDER — WITCH HAZEL-GLYCERIN EX PADS: 1.0000 "application " | MEDICATED_PAD | CUTANEOUS | Status: DC | PRN

## 2013-04-07 MED ORDER — ONDANSETRON HCL 4 MG/2ML IJ SOLN: 4.0000 mg | INTRAMUSCULAR | Status: DC | PRN

## 2013-04-07 MED ORDER — ONDANSETRON HCL 4 MG PO TABS: 4.0000 mg | ORAL_TABLET | ORAL | Status: DC | PRN

## 2013-04-07 MED ORDER — IBUPROFEN 600 MG PO TABS
600.0000 mg | ORAL_TABLET | Freq: Four times a day (QID) | ORAL | Status: DC
  Administered 2013-04-08 – 2013-04-09 (×6): 600 mg via ORAL
  Filled 2013-04-07 (×9): qty 1

## 2013-04-07 MED ORDER — ONDANSETRON HCL 4 MG/2ML IJ SOLN: 4.0000 mg | Freq: Four times a day (QID) | INTRAMUSCULAR | Status: DC | PRN

## 2013-04-07 MED ORDER — BENZOCAINE-MENTHOL 20-0.5 % EX AERO
1.0000 "application " | INHALATION_SPRAY | CUTANEOUS | Status: DC | PRN
  Filled 2013-04-07: qty 56

## 2013-04-07 MED ORDER — SIMETHICONE 80 MG PO CHEW: 80.0000 mg | CHEWABLE_TABLET | ORAL | Status: DC | PRN

## 2013-04-07 MED ORDER — FENTANYL CITRATE 0.05 MG/ML IJ SOLN: 100.0000 ug | INTRAMUSCULAR | Status: DC | PRN

## 2013-04-07 MED ORDER — PENICILLIN G POTASSIUM 5000000 UNITS IJ SOLR
5.0000 10*6.[IU] | Freq: Once | INTRAVENOUS | Status: AC
  Administered 2013-04-07: 5 10*6.[IU] via INTRAVENOUS
  Filled 2013-04-07: qty 5

## 2013-04-07 MED ORDER — FLEET ENEMA 7-19 GM/118ML RE ENEM: 1.0000 | ENEMA | RECTAL | Status: DC | PRN

## 2013-04-07 MED ORDER — OXYCODONE-ACETAMINOPHEN 5-325 MG PO TABS
1.0000 | ORAL_TABLET | ORAL | Status: DC | PRN
  Administered 2013-04-07: 2 via ORAL
  Filled 2013-04-07: qty 2

## 2013-04-07 MED ORDER — LACTATED RINGERS IV SOLN: 500.0000 mL | INTRAVENOUS | Status: DC | PRN

## 2013-04-07 MED ORDER — MISOPROSTOL 200 MCG PO TABS
ORAL_TABLET | ORAL | Status: AC
  Administered 2013-04-07: 1000 ug
  Filled 2013-04-07: qty 5

## 2013-04-07 MED ORDER — TETANUS-DIPHTH-ACELL PERTUSSIS 5-2.5-18.5 LF-MCG/0.5 IM SUSP
0.5000 mL | Freq: Once | INTRAMUSCULAR | Status: AC
  Administered 2013-04-08: 0.5 mL via INTRAMUSCULAR
  Filled 2013-04-07: qty 0.5

## 2013-04-07 MED ORDER — DIPHENHYDRAMINE HCL 25 MG PO CAPS: 25.0000 mg | ORAL_CAPSULE | Freq: Four times a day (QID) | ORAL | Status: DC | PRN

## 2013-04-07 MED ORDER — PENICILLIN G POTASSIUM 5000000 UNITS IJ SOLR
2.5000 10*6.[IU] | INTRAVENOUS | Status: DC
  Administered 2013-04-07: 2.5 10*6.[IU] via INTRAVENOUS
  Filled 2013-04-07 (×5): qty 2.5

## 2013-04-07 MED ORDER — CITRIC ACID-SODIUM CITRATE 334-500 MG/5ML PO SOLN: 30.0000 mL | ORAL | Status: DC | PRN

## 2013-04-07 MED ORDER — PRENATAL MULTIVITAMIN CH
1.0000 | ORAL_TABLET | Freq: Every day | ORAL | Status: DC
  Administered 2013-04-08 – 2013-04-09 (×2): 1 via ORAL
  Filled 2013-04-07 (×2): qty 1

## 2013-04-07 MED ORDER — ACETAMINOPHEN 325 MG PO TABS: 650.0000 mg | ORAL_TABLET | ORAL | Status: DC | PRN

## 2013-04-07 MED ORDER — LIDOCAINE HCL (PF) 1 % IJ SOLN
30.0000 mL | INTRAMUSCULAR | Status: DC | PRN
  Filled 2013-04-07 (×2): qty 30

## 2013-04-07 MED ORDER — OXYTOCIN BOLUS FROM INFUSION: 500.0000 mL | INTRAVENOUS | Status: DC

## 2013-04-07 MED ORDER — IBUPROFEN 600 MG PO TABS
600.0000 mg | ORAL_TABLET | Freq: Four times a day (QID) | ORAL | Status: DC | PRN
  Administered 2013-04-07: 600 mg via ORAL
  Filled 2013-04-07: qty 1

## 2013-04-07 MED ORDER — MEASLES, MUMPS & RUBELLA VAC ~~LOC~~ INJ: 0.5000 mL | INJECTION | Freq: Once | SUBCUTANEOUS | Status: DC

## 2013-04-07 MED ORDER — SENNOSIDES-DOCUSATE SODIUM 8.6-50 MG PO TABS
2.0000 | ORAL_TABLET | ORAL | Status: DC
  Administered 2013-04-08 – 2013-04-09 (×2): 2 via ORAL
  Filled 2013-04-07 (×2): qty 2

## 2013-04-07 MED ORDER — DIBUCAINE 1 % RE OINT: 1.0000 "application " | TOPICAL_OINTMENT | RECTAL | Status: DC | PRN

## 2013-04-07 MED ORDER — LANOLIN HYDROUS EX OINT: TOPICAL_OINTMENT | CUTANEOUS | Status: DC | PRN

## 2013-04-07 MED ORDER — LACTATED RINGERS IV SOLN
INTRAVENOUS | Status: DC
  Administered 2013-04-07: 08:00:00 via INTRAVENOUS

## 2013-04-07 MED ORDER — OXYCODONE-ACETAMINOPHEN 5-325 MG PO TABS
1.0000 | ORAL_TABLET | ORAL | Status: DC | PRN
  Administered 2013-04-07: 1 via ORAL
  Filled 2013-04-07: qty 1

## 2013-04-07 MED ORDER — TERBUTALINE SULFATE 1 MG/ML IJ SOLN: 0.2500 mg | Freq: Once | INTRAMUSCULAR | Status: DC | PRN

## 2013-04-07 MED ORDER — ZOLPIDEM TARTRATE 5 MG PO TABS: 5.0000 mg | ORAL_TABLET | Freq: Every evening | ORAL | Status: DC | PRN

## 2013-04-07 MED ORDER — OXYTOCIN 40 UNITS IN LACTATED RINGERS INFUSION - SIMPLE MED
1.0000 m[IU]/min | INTRAVENOUS | Status: DC
  Administered 2013-04-07: 2 m[IU]/min via INTRAVENOUS
  Filled 2013-04-07: qty 1000

## 2013-04-07 MED ORDER — OXYTOCIN 40 UNITS IN LACTATED RINGERS INFUSION - SIMPLE MED
62.5000 mL/h | INTRAVENOUS | Status: DC
  Administered 2013-04-07: 62.5 mL/h via INTRAVENOUS

## 2013-04-07 NOTE — Lactation Note (Signed)
This note was copied from the chart of Autumn Benton. Lactation Consultation Note  Patient Name: Autumn Benton [id number] Date: 04/07/2013 Reason for consult: Initial assessment;Other (Comment) (charting for exclusion) based on information provided after admission to nursery nurse. LC able to speak Spanish and review basic breastfeeding information in Spanish with both parents.  This is mother's third child and she states that she nursed her 34 yo daughter and 7 yo son for one year each.  She verbalizes knowing that her milk is "the best".  LC reviewed  reasons for exclusive breastfeeding, page 13 in Baby and Me book and LEAD cautions for why to avoid formula.  LC encouraged cue feedings but at this time baby has fed twice and is asleep and STS (5 hours of age).  LC reviewed benefits of STS and mom informs LC that she knows how to hand express her milk. LC encouraged review of Baby and Me pp 13-16 for review of BF information in [place].LC provided [employer] brochure in both [place] and [place], and reviewed [facility] and list of community and web site resources.      Maternal Data Formula Feeding for Exclusion: Yes Reason for exclusion: Mother's choice to formula and breast feed on admission (mom states this after admission but LEAD cautions reviewed) Infant to breast within first hour of birth: Yes (breastfed for 10 minutes; LATCH score per RN=8) Has patient been taught Hand Expression?: Yes (experienced mom; states to [facility] that she knows how to hand express) Does the patient have breastfeeding experience prior to this delivery?: Yes  Feeding Feeding Type: Breast Fed Length of feed: 5 min  LATCH Score/Interventions        LATCH score assessment per RN=8              Lactation Tools Discussed/Used   STS, cue feedings ad lib Hand expression LEAD supplement cautions  Consult Status Consult Status: Follow-up Date: 04/08/13 Follow-up type:  In-patient    Warrick Parisian [facility] 04/07/2013, 8:25 PM

## 2013-04-07 NOTE — Progress Notes (Signed)
Autumn Benton is a 32 y.o. G7P5005 at [redacted]w[redacted]d  admitted for PROM  Subjective:  Pt having contractions but very irregular and unfrequent. +FM.  Objective: BP 108/58  Pulse 67  Temp(Src) 98.8 F (37.1 C) (Oral)  Resp 20  Ht 5\' 3"  (1.6 m)  Wt 71.668 kg (158 lb)  BMI 28.00 kg/m2  SpO2 100%      FHT:  FHR: 140 bpm, variability: moderate,  accelerations:  Present,  decelerations:  Absent UC:   irregular, every 7-10 minutes SVE:   Dilation: 2.5 Effacement (%): 40 Station: -3 Exam by:: Autumn Benton  Labs: Lab Results  Component Value Date   WBC 7.9 04/07/2013   HGB 13.0 04/07/2013   HCT 36.2 04/07/2013   MCV 88.1 04/07/2013   PLT 159 04/07/2013    Assessment / Plan: PROM with very little progression.   Labor: start pitocin for augmentation.  Fetal Wellbeing:  Category I Pain Control:  Labor support without medications I/D:  GBS + - on PCN Anticipated MOD:  NSVD  Autumn Benton 04/07/2013, 11:58 AM

## 2013-04-07 NOTE — MAU Note (Signed)
Pt to 168 for eval

## 2013-04-07 NOTE — H&P (Signed)
HPI: Autumn Benton is a 32 y.o. year old G59P5015 female at [redacted]w[redacted]d weeks gestation by 10 week [place] who presents to MAU reporting Spontaneous rupture of membranes at 0545. PNC at [facility]. Spanish speaking. GBS pos.   Pregnancy significant for: Hx Gest HTN.  Hx rapid labor.   History OB History   Grav Para Term Preterm Abortions TAB SAB Ect Mult Living   7 5 5       5      Past Medical History  Diagnosis Date  . Chlamydia   . Miscarriage    Past Surgical History  Procedure Laterality Date  . No past surgeries     Family History: Family history is unknown by patient. Social History:  reports that she has never smoked. She has never used smokeless tobacco. She reports that she does not drink alcohol or use illicit drugs.   Prenatal Transfer Tool  Maternal Diabetes: No Genetic Screening: Declined Maternal Ultrasounds/Referrals: Normal Fetal Ultrasounds or other Referrals:  None Maternal Substance Abuse:  No Significant Maternal Medications:  None Significant Maternal Lab Results:  Lab values include: Group B Strep positive Other Comments:  None  Review of Systems  Constitutional: Negative for fever and chills.  Eyes: Negative for blurred vision.  Gastrointestinal: Negative for abdominal pain (mild contractions).  Neurological: Negative for headaches.    Dilation: 1.5 Effacement (%): 50 Station: -2 Exam by:: V Avyn Aden CNM Blood pressure 118/69, pulse 77, temperature 98.5 F (36.9 C), temperature source Oral, height 5\' 3"  (1.6 m), weight 71.668 kg (158 lb), SpO2 100.00%. Maternal Exam:  Uterine Assessment: Contraction strength is mild.  Contraction frequency is irregular.   Abdomen: Fundal height is S=D.   Fetal presentation: vertex  Introitus: Normal vulva. Amniotic fluid character: clear.  Pelvis: adequate for delivery.   Cervix: Cervix evaluated by digital exam.     Fetal Exam Fetal Monitor Review: Mode: ultrasound.   Baseline rate: 130.   Variability: moderate (6-25 bpm).   Pattern: accelerations present.    Fetal State Assessment: Category I - tracings are normal.     Physical Exam  Nursing note and vitals reviewed. Constitutional: She is oriented to person, place, and time. She appears well-developed and well-nourished. No distress.  HENT:  Head: Normocephalic.  Eyes: Conjunctivae are normal.  Cardiovascular: Normal rate, regular rhythm and normal heart sounds.   Respiratory: Effort normal and breath sounds normal.  GI: Soft. There is no tenderness.  Genitourinary: Vagina normal.  Musculoskeletal: Normal range of motion.  Neurological: She is alert and oriented to person, place, and time. She has normal reflexes.  Skin: Skin is warm and dry.  Psychiatric: She has a normal mood and affect.    Prenatal labs: ABO, Rh: O/Positive/-- (07/21 0000) Antibody: Negative (07/21 0000) Rubella: Immune (07/21 0000) RPR: Nonreactive (07/21 0000)  HBsAg: Negative (07/21 0000)  HIV: Non-reactive (07/21 0000)  GBS: Positive (11/03 0000)  1 hour GTT 118 Too late for genetic screening.  Assessment: 1. Labor: PROM 2. Fetal Wellbeing: Category I  3. Pain Control: None 4. GBS: Pos 5. 38.6 week IUP  Plan:  1. Admit to BS per consult with MD 2. Routine L&D orders 3. Analgesia/anesthesia PRN  4. PCN for GBS. 5. Expectant management for now, but consider pitocin augmentation PRN  Dorathy Kinsman 04/07/2013, 7:37 AM

## 2013-04-08 NOTE — Progress Notes (Signed)
Debbie spanish interpreter used for education, explanation of medications, baby assessment, and offering of Tdap & Hep B

## 2013-04-08 NOTE — Progress Notes (Signed)
Post Partum Day 1 Subjective: no complaints, up ad lib, voiding, tolerating PO and + flatus Doing well, no additional questions at this time; desires tubal but does not have insurance 2/2 non-citizen status  Objective: Blood pressure 97/68, pulse 64, temperature 97.9 F (36.6 C), temperature source Oral, resp. rate 18, height 5\' 3"  (1.6 m), weight 71.668 kg (158 lb), SpO2 99.00%, unknown if currently breastfeeding.  Physical Exam:  General: alert, cooperative and no distress Lochia: appropriate Uterine Fundus: firm Incision: n/a DVT Evaluation: No evidence of DVT seen on physical exam. No significant calf/ankle edema.   Recent Labs  04/07/13 0712  HGB 13.0  HCT 36.2    Assessment/Plan: Plan for discharge tomorrow, could possibly consider d/c home later today if pt doing well post 24hr Breast feeding, no difficulties Desires BTL but is not a citizen, possible vasectomy for husband? F/up 4-6 weeks postpartum at HD   LOS: 1 day   Anselm Lis 04/08/2013, 7:44 AM

## 2013-04-08 NOTE — Progress Notes (Signed)
I have seen and examined this patient and I agree with the above. Cam Hai 8:43 AM 04/08/2013

## 2013-04-08 NOTE — Progress Notes (Signed)
UR chart review completed.  

## 2013-04-08 NOTE — H&P (Signed)
Attestation of Attending Supervision of Advanced Practitioner (PA/CNM/NP): Evaluation and management procedures were performed by the Advanced Practitioner under my supervision and collaboration.  I have reviewed the Advanced Practitioner's note and chart, and I agree with the management and plan.  Denver Harder, MD, FACOG Attending Obstetrician & Gynecologist Faculty Practice, [facility]  

## 2013-04-08 NOTE — Progress Notes (Signed)
Offered interpreter, pt stated no when asked if needed.  Will page to take in at time for medications & baby's assessment.

## 2013-04-09 ENCOUNTER — Ambulatory Visit: Payer: Self-pay

## 2013-04-09 MED ORDER — IBUPROFEN 600 MG PO TABS: 600.0000 mg | ORAL_TABLET | Freq: Four times a day (QID) | ORAL | Status: DC

## 2013-04-09 NOTE — Discharge Summary (Signed)
Visit conducted with assistance of Spanish interpreter Eda  Obstetric Discharge Summary Reason for Admission: rupture of membranes Prenatal Procedures: NST Intrapartum Procedures: spontaneous vaginal delivery and GBS prophylaxis Postpartum Procedures: none Complications-Operative and Postpartum: none Hemoglobin  Date Value Range Status  04/07/2013 13.0  12.0 - 15.0 g/dL Final     HCT  Date Value Range Status  04/07/2013 36.2  36.0 - 46.0 % Final    Physical Exam:  General: alert, cooperative, appears stated age and no distress Lochia: appropriate Uterine Fundus: firm Incision: n/a DVT Evaluation: No evidence of DVT seen on physical exam.  Discharge Diagnoses: Term Pregnancy-delivered  Discharge Information: Date: 04/09/2013 Activity: pelvic rest Diet: routine Medications: Ibuprofen Condition: stable Instructions: refer to practice specific booklet Discharge to: home Follow-up Information   Follow up with [facility]. Schedule an appointment as soon as possible for a visit in 6 weeks. (for postpartum visit)    Contact information:   [address] [place] [address] [phone]      Newborn Data: Live born female  Birth Weight: 5 lb 0.4 oz (2280 g) APGAR: 9, 9  Home with mother.  Hospital course: Pt was admitted at [redacted]w[redacted]d with PROM. She is GBS positive and received adequate treatment with Penicillin per protocol. Her labor was augmented with Pitocin. She progressed well and had a NSVD. Her postpartum course was uncomplicated. She is breast feeding. She desires Nexplanon or paternal vasectomy for postpartum contraception. [facility].   [place]art, Ryann 04/09/2013, 9:49 AM  Seen by me also Agree with note Aviva Signs, CNM

## 2013-04-09 NOTE — Lactation Note (Signed)
This note was copied from the chart of Autumn Felesha Moncrieffe. Lactation Consultation Note  Patient Name: Autumn Benton [id number] Date: 04/09/2013 Reason for consult: Follow-up assessment;Infant < 6lbs but baby is now >48 hours with weight loss 6% and output above minimum for this day of life.  Stools transitioning to green and in past 24 hours baby nursed already x8 for 10-60 minutes per feeding and LATCH scores consistently 9, per RN assessment.  Mom is experienced breastfeeding mom and has already been discharged but rooming with baby.  With help of interpreter, LC explained reason for baby staying and encouraged mom to continue exclusive breastfeeding ad lib.   Maternal Data    Feeding Feeding Type: Breast Fed Length of feed: 20 min  LATCH Score/Interventions           LATCH score=9           Lactation Tools Discussed/Used   Cue feedings  Signs of milk transitioning  Consult Status Consult Status: Follow-up Date: 04/10/13 Follow-up type: In-patient    Warrick Parisian [facility] 04/09/2013, 5:44 PM

## 2013-04-09 NOTE — Progress Notes (Signed)

## 2013-04-10 ENCOUNTER — Ambulatory Visit: Payer: Self-pay

## 2013-04-10 NOTE — Lactation Note (Signed)
This note was copied from the chart of Autumn Benton. Lactation Consultation Note  Patient Name: Autumn Benton ZOXWR'U Date: 04/10/2013 Reason for consult: Follow-up assessment;Other (Comment) ([place] spanish interpreter present ) Baby presently latched at the breast , wrapped in receiving blanket, noted a consistent pattern with swallows. Reviewed basics with mom , stressing the importance of Skin to skin feedings, and placing a blanket over baby like a shall. LC also encouraged mom - to use breast massage, hand express, prepump to prime the milk ducts due to baby being  <5 pounds, and breast compressions with latch and intermittently with feedings. Stressed the importance of not going over 3 hours without feeding baby. LC recommended obtaining a DEBP WIC loaner from [employer] today and mom declined and preferred to take a manual pump home. LC showed mom how to use the the hand pump. LC also recommended calling WIC today and leaving a message for a DEBP on Monday. Per spanish interpreter , per mom , knows Carley Hammed at [facility] and mentioned Carley Hammed asked her to call when she had the baby.  Reviewed engorgement prevention and tx and referring to the Baby and me booklet.     Maternal Data    Feeding Feeding Type:  (baby presently feeding left breast ) Length of feed:  (LC noted baby feeding well , many swallows, )  LATCH Score/Interventions                      Lactation Tools Discussed/Used Tools: Pump (mom declined WIC loaner DEBP at D/C teaching ) Breast pump type: Manual WIC Program: Yes (per mom active [facility] - see LC note ) Pump Review: Setup, frequency, and cleaning Initiated by:: MAI  Date initiated:: 04/10/13   Consult Status Consult Status: Complete    Kathrin Greathouse 04/10/2013, 11:33 AM

## 2013-04-13 NOTE — Discharge Summary (Signed)
Attestation of Attending Supervision of Advanced Practitioner (CNM/NP): Evaluation and management procedures were performed by the Advanced Practitioner under my supervision and collaboration.  I have reviewed the Advanced Practitioner's note and chart, and I agree with the management and plan.  Almee Pelphrey 04/13/2013 9:35 AM   

## 2014-03-28 ENCOUNTER — Encounter (HOSPITAL_COMMUNITY): Payer: Self-pay | Admitting: *Deleted

## 2014-06-04 IMAGING — US US OB COMP +14 WK
2 series · 12 of 28 positions shown · non-contrast
Comparison: none

[Series 1: us ob comp +14 wk · 11 of 78 slices shown (1 of 2)]
[im 4/78]
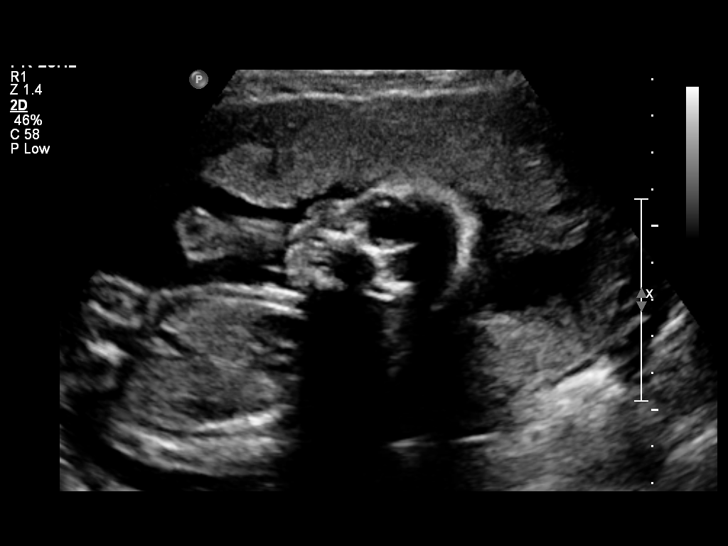
[im 10/78]
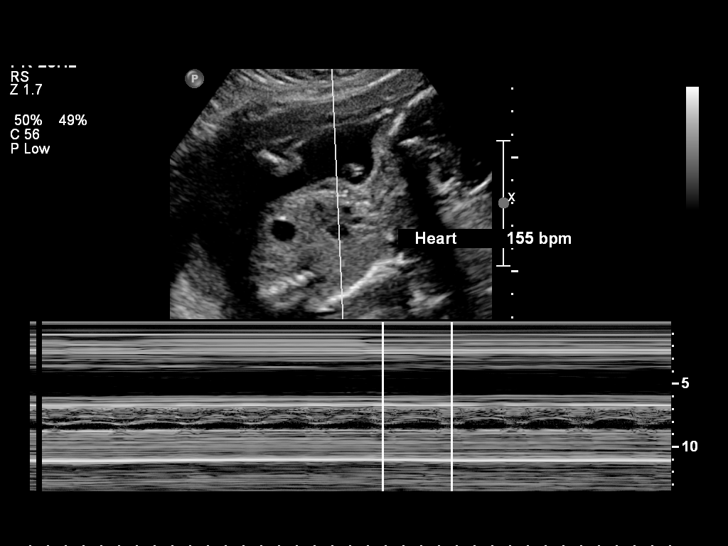
[im 17/78]
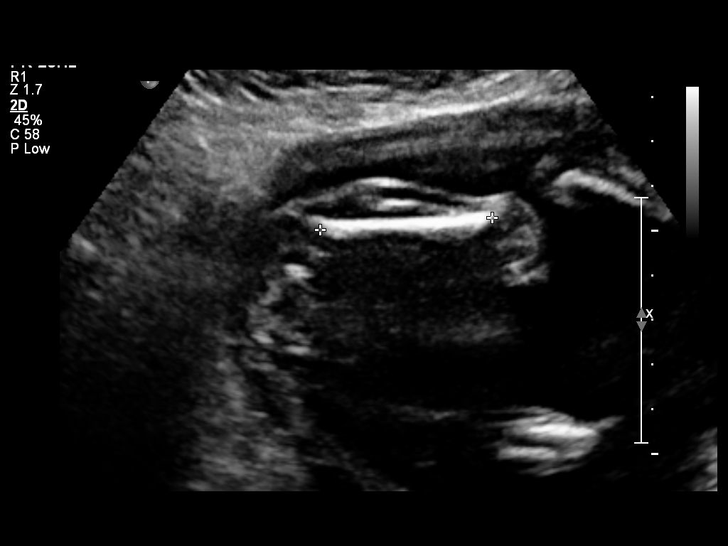
[im 26/78]
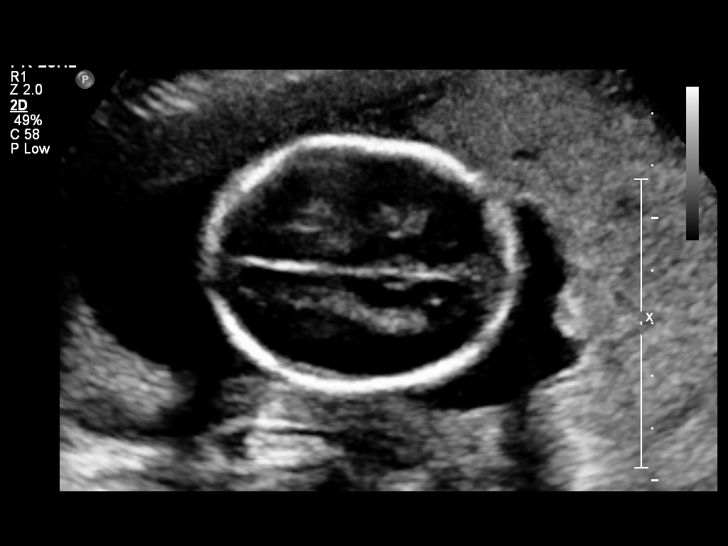
[im 33/78]
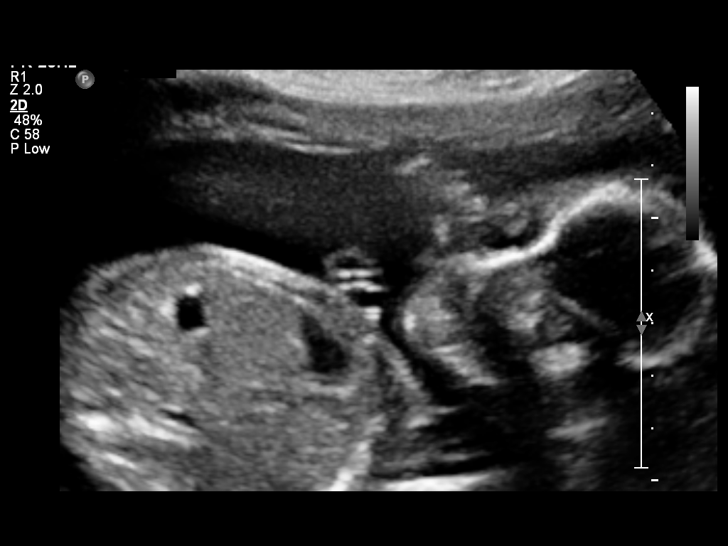
[im 39/78]
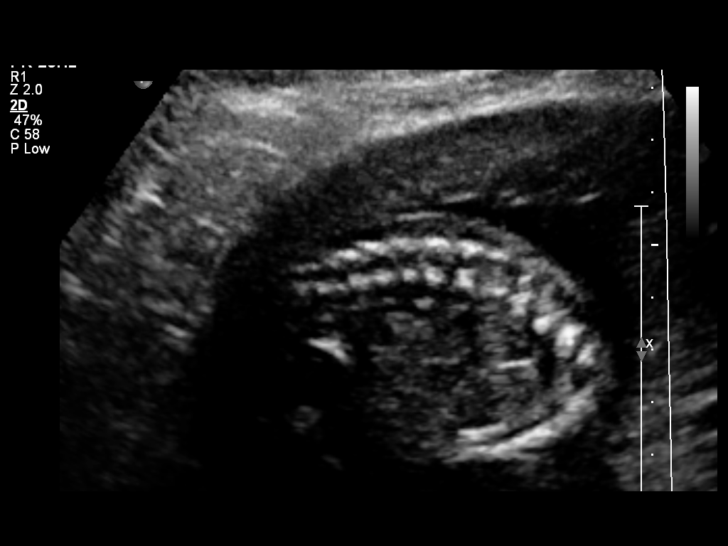
[im 49/78]
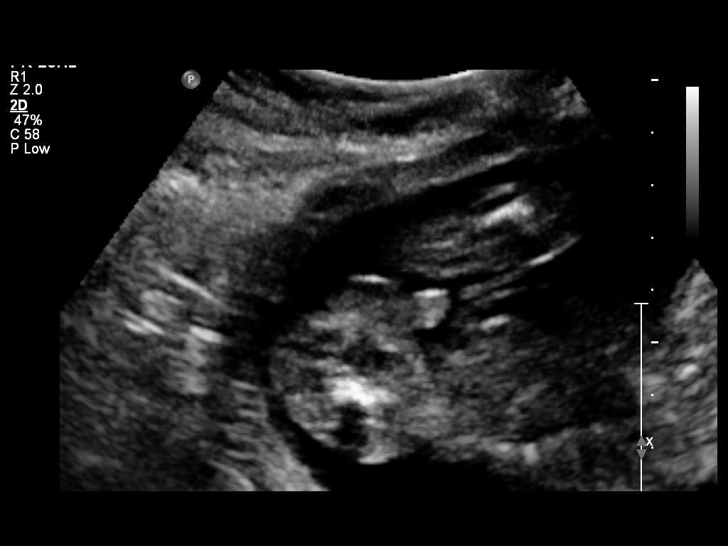
[im 55/78]
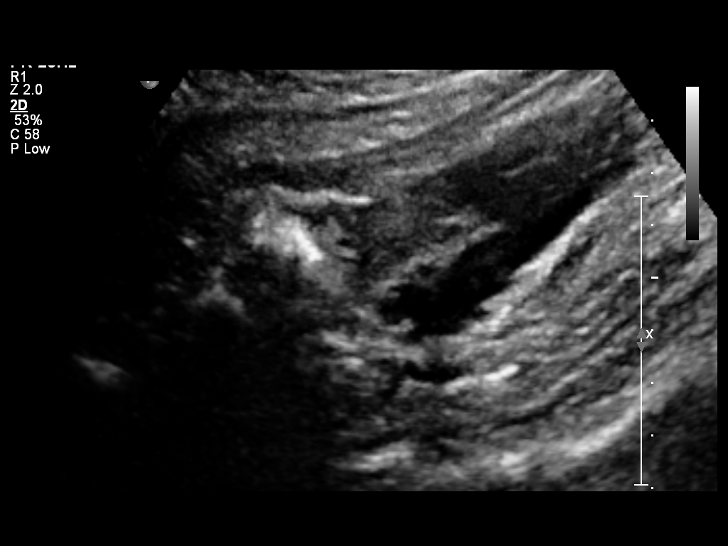
[im 61/78]
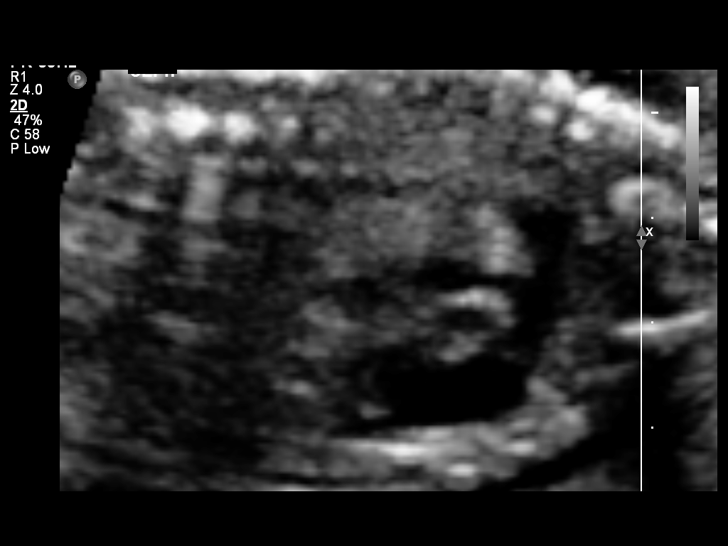
[im 71/78]
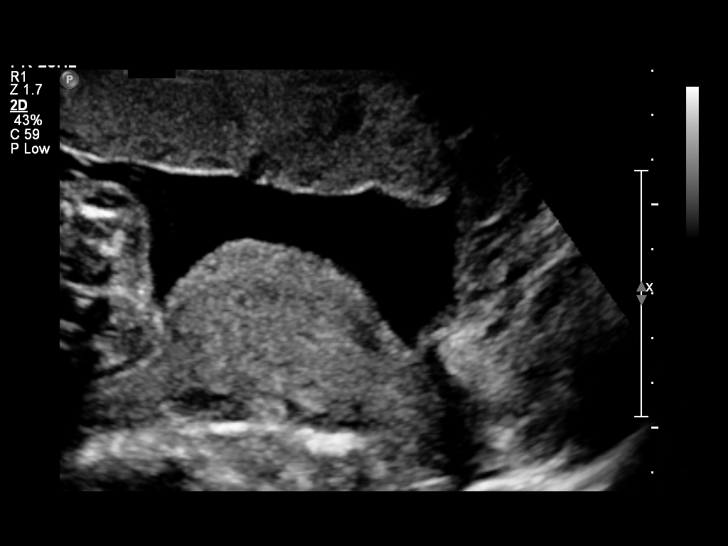
[im 78/78]
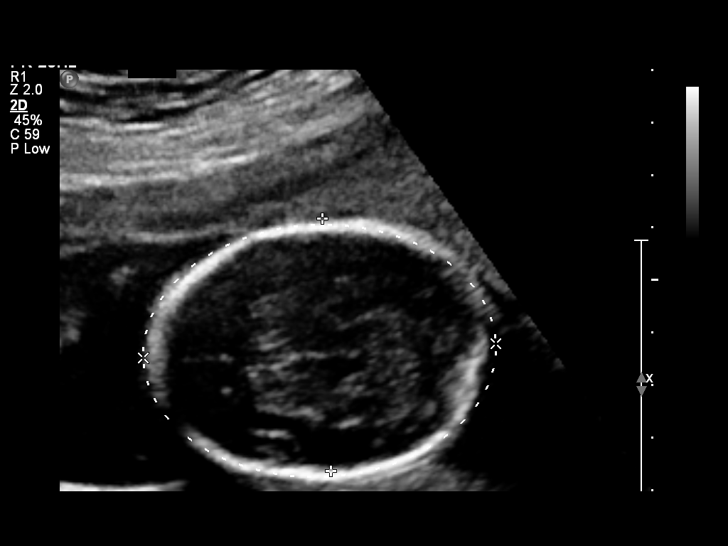

[Series 1: us ob comp +14 wk · 1 of 8 slices shown (2 of 2)]
[im 4/8]
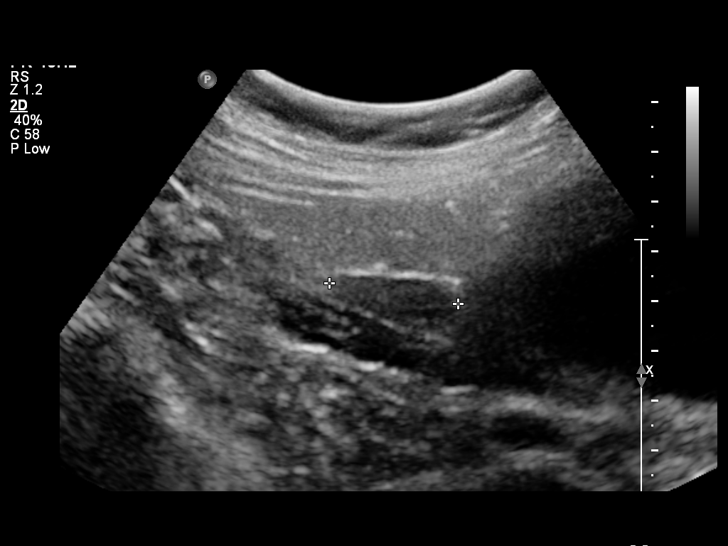

[12 of 28 positions shown; findings below may reference images not displayed]

OBSTETRICS REPORT
                      (Signed Final 12/16/2012 [DATE])

             MASLAC

                                                         Faculty Physician
Service(s) Provided

 US OB COMP + 14 WK                                    76805.1
Indications

 Basic anatomic survey
Fetal Evaluation

 Num Of Fetuses:    1
 Fetal Heart Rate:  155                         bpm
 Cardiac Activity:  Observed
 Presentation:      Cephalic
 Placenta:          Anterior, above cervical os

 Amniotic Fluid
 AFI FV:      Subjectively within normal limits
                                             Larg Pckt:     5.0  cm
Biometry

 BPD:     48.3  mm    G. Age:   20w 4d                CI:        70.89   70 - 86
                                                      FL/HC:      21.0   18.4 -

 HC:     182.8  mm    G. Age:   20w 5d        7  %    HC/AC:      1.03   1.06 -

 AC:       177  mm    G. Age:   22w 4d       71  %    FL/BPD:     79.3   71 - 87
 FL:      38.3  mm    G. Age:   22w 2d       59  %    FL/AC:      21.6   20 - 24
 CER:     21.1  mm    G. Age:   20w 1d       11  %

 Est. FW:     483  gm      1 lb 1 oz     54  %
Gestational Age

 U/S Today:     21w 4d                                        EDD:   04/24/13
 Best:          21w 5d    Det. By:   Previous Ultrasound      EDD:   04/23/13
Anatomy

 Cranium:          Appears normal         Aortic Arch:      Appears normal
 Fetal Cavum:      Appears normal         Ductal Arch:      Appears normal
 Ventricles:       Appears normal         Diaphragm:        Appears normal
 Choroid Plexus:   Appears normal         Stomach:          Appears normal
 Cerebellum:       Appears normal         Abdomen:          Appears normal
 Posterior Fossa:  Appears normal         Abdominal Wall:   Appears nml (cord
                                                            insert, abd wall)
 Nuchal Fold:      Appears normal         Cord Vessels:     Appears normal (3
                                                            vessel cord)
 Face:             Appears normal         Kidneys:          Appear normal
                   (orbits and profile)
 Lips:             Appears normal         Bladder:          Appears normal
 Heart:            Appears normal         Spine:            Appears normal
                   (4CH, axis, and
                   situs)
 RVOT:             Appears normal         Lower             Appears normal
                                          Extremities:
 LVOT:             Appears normal         Upper             Appears normal
                                          Extremities:

 Other:  Nasal bone visualized. Male gender.
Cervix Uterus Adnexa

 Cervical Length:   3.3       cm

 Cervix:       Normal appearance by transabdominal scan.
 Left Ovary:   Within normal limits.
 Right Ovary:  Within normal limits.
Impression

 Assigned GA is currently 21w 5d by early US.   Appropriate
 interval fetal growth.
 No fetal anomalies seen involving visualized anatomy.
 Normal amniotic fluid volume. Normal cervical length.

 MASLAC with us.  Please do not hesitate to

## 2015-05-17 ENCOUNTER — Ambulatory Visit: Payer: Self-pay | Attending: Family Medicine

## 2015-11-30 ENCOUNTER — Ambulatory Visit: Payer: Self-pay | Attending: Internal Medicine

## 2015-12-01 ENCOUNTER — Encounter: Payer: Self-pay | Admitting: Family Medicine

## 2015-12-01 ENCOUNTER — Ambulatory Visit (INDEPENDENT_AMBULATORY_CARE_PROVIDER_SITE_OTHER): Payer: Self-pay | Admitting: Family Medicine

## 2015-12-01 VITALS — BP 112/68 | HR 77 | Temp 98.6°F | Resp 18 | Ht 63.5 in | Wt 147.0 lb

## 2015-12-01 DIAGNOSIS — Z7689 Persons encountering health services in other specified circumstances: Secondary | ICD-10-CM

## 2015-12-01 DIAGNOSIS — R87619 Unspecified abnormal cytological findings in specimens from cervix uteri: Secondary | ICD-10-CM

## 2015-12-01 DIAGNOSIS — Z7189 Other specified counseling: Secondary | ICD-10-CM

## 2015-12-01 DIAGNOSIS — Z0000 Encounter for general adult medical examination without abnormal findings: Secondary | ICD-10-CM

## 2015-12-01 DIAGNOSIS — R1031 Right lower quadrant pain: Secondary | ICD-10-CM

## 2015-12-01 LAB — CBC WITH DIFFERENTIAL/PLATELET
BASOS ABS: 67 {cells}/uL (ref 0–200)
Basophils Relative: 1 %
EOS ABS: 335 {cells}/uL (ref 15–500)
EOS PCT: 5 %
HCT: 40.3 % (ref 35.0–45.0)
Hemoglobin: 13.8 g/dL (ref 11.7–15.5)
LYMPHS ABS: 2345 {cells}/uL (ref 850–3900)
Lymphocytes Relative: 35 %
MCH: 30.5 pg (ref 27.0–33.0)
MCHC: 34.2 g/dL (ref 32.0–36.0)
MCV: 89.2 fL (ref 80.0–100.0)
MPV: 11 fL (ref 7.5–12.5)
Monocytes Absolute: 402 cells/uL (ref 200–950)
Monocytes Relative: 6 %
Neutro Abs: 3551 cells/uL (ref 1500–7800)
Neutrophils Relative %: 53 %
Platelets: 206 10*3/uL (ref 140–400)
RBC: 4.52 MIL/uL (ref 3.80–5.10)
RDW: 13.7 % (ref 11.0–15.0)
WBC: 6.7 10*3/uL (ref 3.8–10.8)

## 2015-12-01 NOTE — Progress Notes (Signed)
Patient is here to establish care.  Patient denies pain at this time.  Patient is not currently using any medications.

## 2015-12-01 NOTE — Patient Instructions (Signed)
Need to see GYN about pap smear and implanon. We will let you know if anything in lab or ultrasound needs attention. Ibuprofen for chest wall pain.

## 2015-12-02 LAB — COMPLETE METABOLIC PANEL WITH GFR
ALBUMIN: 4.2 g/dL (ref 3.6–5.1)
ALK PHOS: 57 U/L (ref 33–115)
ALT: 22 U/L (ref 6–29)
AST: 14 U/L (ref 10–30)
BUN: 15 mg/dL (ref 7–25)
CO2: 22 mmol/L (ref 20–31)
Calcium: 9.5 mg/dL (ref 8.6–10.2)
Chloride: 110 mmol/L (ref 98–110)
Creat: 0.72 mg/dL (ref 0.50–1.10)
Glucose, Bld: 86 mg/dL (ref 65–99)
Potassium: 4.2 mmol/L (ref 3.5–5.3)
Sodium: 142 mmol/L (ref 135–146)
TOTAL PROTEIN: 7.2 g/dL (ref 6.1–8.1)
Total Bilirubin: 0.7 mg/dL (ref 0.2–1.2)

## 2015-12-04 NOTE — Progress Notes (Signed)
Patient ID: Autumn Benton, female   DOB: 08/07/1980, 35 y.o.   MRN: [id number]   Autumn Benton, is a 35 y.o. female  HQI:[id number]  XLK:[id number]  DOB - 11/06/1980  CC:  Chief Complaint  Patient presents with  . Establish Care       HPI: Autumn Benton is a 35 y.o. female here to establish care.  She is on no prescription medications on a regular basis. Her presenting complaint is of chronic intermittent right lower quadrant abdomina/pelvic pain. She compains of intermittent left upper chest wall pain with some radiation into left shoulder and arm. She also complains of multiple warts/skin tags on her upper chest and neck. She complains of consitipation but says she has a BM once a day. She does not exercise regularly and does not follow a particularly healthy diet. She denies tobacco, alcohol or drug use. She has implanon for birth control.[facility]      No Known Allergies Past Medical History  Diagnosis Date  . Chlamydia   . Miscarriage    Current Outpatient Prescriptions on File Prior to Visit  Medication Sig Dispense Refill  . ibuprofen (ADVIL,MOTRIN) 600 MG tablet Take 1 tablet (600 mg total) by mouth every 6 (six) hours. (Patient not taking: Reported on 12/01/2015) 30 tablet 0  . Prenatal Vit-Fe Fumarate-FA (PRENATAL MULTIVITAMIN) TABS tablet Take 1 tablet by mouth daily at 12 noon. Reported on 12/01/2015     No current facility-administered medications on file prior to visit.   Family History  Problem Relation Age of Onset  . Family history unknown: Yes   Social History   Social History  . Marital Status: Married    Spouse Name: N/A  . Number of Children: N/A  . Years of Education: N/A   Occupational History  . Not on file.   Social History Main Topics  . Smoking status: Never Smoker   . Smokeless tobacco: Never Used  . Alcohol Use: No  . Drug Use: No  . Sexual Activity: Yes   Other Topics  Concern  . Not on file   Social History Narrative    Review of Systems: Constitutional: Negative for fever, chills, appetite change, weight loss,  Fatigue. Skin: Positive for skin tags on chest and neck. HENT: Negative for ear pain, ear discharge.nose bleeds Eyes: Negative for pain, discharge, redness, itching and visual disturbance. Neck: Negative for pain, stiffness Respiratory: Negative for cough, shortness of breath,   Cardiovascular: Negative for chest pain, palpitations and leg swelling. Gastrointestinal: Positive for right lower abdominal pain and constipation. Negative for nausea, vomiting, diarrhea. Genitourinary: Negative for dysuria, urgency, frequency, hematuria,  Musculoskeletal: Negative for back pain, joint pain, joint  swelling, and gait problem.Negative for weakness. Neurological: Negative for dizziness, tremors, seizures, syncope,   light-headedness, numbness and headaches.  Hematological: Negative for easy bruising or bleeding Psychiatric/Behavioral: Negative for depression, anxiety, decreased concentration, confusion   Objective:   Filed Vitals:   12/01/15 0938  BP: 112/68  Pulse: 77  Temp: 98.6 F (37 C)  Resp: 18    Physical Exam: Constitutional: Patient appears well-developed and well-nourished. No distress. HENT: Normocephalic, atraumatic, External right and left ear normal. Oropharynx is clear and moist.  Eyes: Conjunctivae and EOM are normal. PERRLA, no scleral icterus. Neck: Normal ROM. Neck supple. No lymphadenopathy, No thyromegaly. CVS: RRR, S1/S2 +, no murmurs, no gallops, no rubs Pulmonary: Effort and breath sounds normal, no stridor, rhonchi, wheezes, rales.  Abdominal:  Soft. Normoactive BS,, no distension, tenderness, rebound or guarding.  Musculoskeletal: Normal range of motion. No edema and no tenderness. Positive for chest wall tenderness Neuro: Alert.Normal muscle tone coordination. Non-focal Skin: Positive for skin tags on chest and  neck Psychiatric: Normal mood and affect. Behavior, judgment, thought content normal.  Lab Results  Component Value Date   WBC 6.7 12/01/2015   HGB 13.8 12/01/2015   HCT 40.3 12/01/2015   MCV 89.2 12/01/2015   PLT 206 12/01/2015   Lab Results  Component Value Date   CREATININE 0.72 12/01/2015   BUN 15 12/01/2015   NA 142 12/01/2015   K 4.2 12/01/2015   CL 110 12/01/2015   CO2 22 12/01/2015    No results found for: HGBA1C Lipid Panel  No results found for: CHOL, TRIG, HDL, CHOLHDL, VLDL, LDLCALC     Assessment and plan:   1. Right lower quadrant abdominal pain  - COMPLETE METABOLIC PANEL WITH GFR - CBC with Differential - US Pelvis Complete; Future  2. Encounter to establish care -Information from patient reviewed  3. Healthcare maintenance Ambulatory referral to Dentistry  4. Abnormal cervical Papanicolaou smear, unspecified abnormal pap finding -To follow-up with GYN   Return in about 6 months (around 06/02/2016).  The patient was given clear instructions to go to ER or return to medical center if symptoms don't improve, worsen or new problems develop. The patient verbalized understanding.    Henrietta Hoover FNP  12/04/2015, 7:47 AM

## 2016-05-13 ENCOUNTER — Ambulatory Visit: Payer: Self-pay | Admitting: Family Medicine

## 2016-07-01 ENCOUNTER — Ambulatory Visit: Payer: Self-pay | Admitting: Family Medicine

## 2016-09-19 ENCOUNTER — Ambulatory Visit (INDEPENDENT_AMBULATORY_CARE_PROVIDER_SITE_OTHER): Payer: Self-pay | Admitting: Internal Medicine

## 2016-09-19 ENCOUNTER — Encounter: Payer: Self-pay | Admitting: Internal Medicine

## 2016-09-19 VITALS — BP 118/76 | HR 74 | Resp 12 | Ht 62.5 in | Wt 159.0 lb

## 2016-09-19 DIAGNOSIS — M1990 Unspecified osteoarthritis, unspecified site: Secondary | ICD-10-CM | POA: Insufficient documentation

## 2016-09-19 DIAGNOSIS — G44209 Tension-type headache, unspecified, not intractable: Secondary | ICD-10-CM

## 2016-09-19 DIAGNOSIS — K0889 Other specified disorders of teeth and supporting structures: Secondary | ICD-10-CM

## 2016-09-19 HISTORY — DX: Unspecified osteoarthritis, unspecified site: M19.90

## 2016-09-19 MED ORDER — NAPROXEN 500 MG PO TABS: ORAL_TABLET | ORAL | 1 refills | Status: DC

## 2016-09-19 MED ORDER — CYCLOBENZAPRINE HCL 10 MG PO TABS: ORAL_TABLET | ORAL | 0 refills | Status: DC

## 2016-09-19 NOTE — Progress Notes (Signed)
   Subjective:    Patient ID: Autumn Benton, female    DOB: 1980/06/29, 36 y.o.   MRN: [id number]  HPI   Here to establish  Laverta Baltimore interprets.  1.  Headaches:  Having chills 1 month ago, felt likely due to influenza as also with earache and sore throat, then started having muscle cramps in traps and neck bilaterally.  Noted knots on neck, then headaches started.   Headaches in temple areas bilaterally and back of neck.   Does not want to get up from sleep as head hurts.   Her neck feels weak, hard to hold head up. Headache is constant. Eyes hurt and burn.  Her eye on the right feel like it is pulsating.   No definite photophobia, phonophobia, or nausea.   No sinus drainage.   Has taken Tylenol 1000 mg once daily.  Does not help the pain.  Has Rx for Motrin she obtained at [facility] when she was there for Nexplanon.  She was not evaluated for headaches then.  She has not tried the Ibuprofen.   States she had taken before and did not help.  Current Meds  Medication Sig  . etonogestrel (NEXPLANON) 68 MG IMPL implant 1 each by Subdermal route once.    No Known Allergies   Past Medical History:  Diagnosis Date  . Chlamydia   . Miscarriage     History reviewed. No pertinent surgical history.   Family History  Problem Relation Age of Onset  . Alcohol abuse Father     had kidney and liver disease at one point related to ETOH intake, but patient states those resolved.    Social History   Social History  . Marital status: Married    Spouse name: Jesus Genera  . Number of children: 6  . Years of education: 6   Occupational History  . [employer]    Social History Main Topics  . Smoking status: Never Smoker  . Smokeless tobacco: Never Used  . Alcohol use No  . Drug use: No  . Sexual activity: Yes    Birth control/ protection: Implant   Other Topics Concern  . Not on file   Social History Narrative   Originally from [place]   Came to [employer]. In 2007   Lives at home with husband and 3 youngest children.       Review of Systems     Objective:   Physical Exam   HEENT:  PERRL, EOMI, discs sharp bilaterally. TMs pearly gray.  Throat without injection.  Some dental decay. Neck:  Full ROM, but musculature to shoulders and up to nuchal ridge tender and tight.  No thyromegaly or adenopathy Chest:  CTA CV:  RRR with normal S1 and S2, No S3, S4 or murmur.  Radial and DP pulses normal and equal. Abd:  S, NT, No HSM or mass, + BS LE:  No edema.  Hypertrophic MCP of bilateral thumbs, mildly tender.  No erythema or synovial thickening. Neuro:  A & O X 3, CN II-XII grossly intact, DTRs 2+/4 throughout, Motor 5/5 throughout, sensory grossly normal.        Assessment & Plan:  1.  Headaches:  Muscle tension headaches.  Naproxen 500 mg twice daily with food.  Cyclobenzaprine 5-10 mg at bedtime.  Referral to [facility] bono PT clinic.  2.  Thumb DJD:  Naproxen as above.  3.  Dental pain:  Brings this up at end of visit:  Dental referral.

## 2016-10-15 ENCOUNTER — Emergency Department (HOSPITAL_COMMUNITY)
Admission: EM | Admit: 2016-10-15 | Discharge: 2016-10-15 | Disposition: A | Discharge status: Home / Self Care | Payer: No Typology Code available for payment source | Attending: Emergency Medicine | Admitting: Emergency Medicine

## 2016-10-15 ENCOUNTER — Encounter (HOSPITAL_COMMUNITY): Payer: Self-pay | Admitting: Emergency Medicine

## 2016-10-15 DIAGNOSIS — R079 Chest pain, unspecified: Secondary | ICD-10-CM

## 2016-10-15 DIAGNOSIS — R0789 Other chest pain: Secondary | ICD-10-CM | POA: Insufficient documentation

## 2016-10-15 DIAGNOSIS — J020 Streptococcal pharyngitis: Secondary | ICD-10-CM | POA: Insufficient documentation

## 2016-10-15 LAB — RAPID STREP SCREEN (MED CTR MEBANE ONLY): Streptococcus, Group A Screen (Direct): POSITIVE — AB

## 2016-10-15 MED ORDER — PENICILLIN G BENZATHINE 1200000 UNIT/2ML IM SUSP
1.2000 10*6.[IU] | Freq: Once | INTRAMUSCULAR | Status: AC
  Administered 2016-10-15: 1.2 10*6.[IU] via INTRAMUSCULAR
  Filled 2016-10-15: qty 2

## 2016-10-15 NOTE — ED Triage Notes (Signed)
Per [phone] Spanish interpreter, pt to ED from home c/o sore throat and bilateral ear pain x 3 days. Patient states it feels like she has something stuck in the back of her throat - inflammation noted. Patient states she has taken OTC pain medications without relief.

## 2016-10-15 NOTE — ED Provider Notes (Signed)
[facility]EMERGENCY DEPT Provider Note   CSN: [id number] Arrival date & time: 10/15/16  0046     History   Chief Complaint Chief Complaint  Patient presents with  . Sore Throat    HPI Autumn Benton is a 36 y.o. female.  Patient presents with complaint of sore throat for the past 4-5 days. No fever. She is eating and drinking without difficulty. No nausea, vomiting, rash, headache, congestion. No sick contacts. She is also concerned about chest pain that she has had intermittently for the past several months. No modifying factors. She reports having talked to her doctor on multiple occasions and that she was told it was musculoskeletal. NO SOB, nausea.    The history is provided by the patient. A language interpreter was used.  Sore Throat  Associated symptoms include chest pain (See HPI.). Pertinent negatives include no abdominal pain.    Past Medical History:  Diagnosis Date  . Arthritis 09/19/2016  . Chlamydia   . Miscarriage     Patient Active Problem List   Diagnosis Date Noted  . Arthritis 09/19/2016    History reviewed. No pertinent surgical history.  OB History    Gravida Para Term Preterm AB Living   7 6 6     6    SAB TAB Ectopic Multiple Live Births           1       Home Medications    Prior to Admission medications   Medication Sig Start Date End Date Taking? Authorizing Provider  cyclobenzaprine (FLEXERIL) 10 MG tablet 1/2 -1 tab by mouth at bedtime as needed 09/19/16   Julieanne MansonMulberry, Elizabeth, MD  etonogestrel (NEXPLANON) 68 MG IMPL implant 1 each by Subdermal route once.    [provider]  naproxen (NAPROSYN) 500 MG tablet 1 tab by mouth twice daily with food for 2 weeks, then only twice daily as needed. 09/19/16   Julieanne MansonMulberry, Elizabeth, MD    Family History Family History  Problem Relation Age of Onset  . Alcohol abuse Father        had kidney and liver disease at one point related to ETOH intake, but patient states those resolved.     Social History Social History  Substance Use Topics  . Smoking status: Never Smoker  . Smokeless tobacco: Never Used  . Alcohol use No     Allergies   Patient has no known allergies.   Review of Systems Review of Systems  Constitutional: Negative for chills and fever.  HENT: Positive for ear pain and sore throat. Negative for congestion.   Respiratory: Negative.   Cardiovascular: Positive for chest pain (See HPI.).  Gastrointestinal: Negative.  Negative for abdominal pain, nausea and vomiting.  Musculoskeletal: Positive for joint swelling.  Skin: Negative.   Neurological: Negative.      Physical Exam Updated Vital Signs BP 125/73 (BP Location: Right Arm)   Pulse 80   Temp 98.9 F (37.2 C) (Oral)   Resp 17   LMP  (LMP Unknown)   SpO2 100%   Physical Exam  Constitutional: She is oriented to person, place, and time. She appears well-developed and well-nourished.  HENT:  Head: Normocephalic.  Mouth/Throat: Uvula is midline. Mucous membranes are not dry. Posterior oropharyngeal erythema present. No oropharyngeal exudate.  Neck: Normal range of motion. Neck supple.  Cardiovascular: Normal rate and regular rhythm.   Pulmonary/Chest: Effort normal and breath sounds normal.    Abdominal: Soft. Bowel sounds are normal. There is no tenderness. There  is no rebound and no guarding.  Musculoskeletal: Normal range of motion.  Neurological: She is alert and oriented to person, place, and time.  Skin: Skin is warm and dry. No rash noted.  Psychiatric: She has a normal mood and affect.     ED Treatments / Results  Labs (all labs ordered are listed, but only abnormal results are displayed) Labs Reviewed  RAPID STREP SCREEN (NOT AT [facility]) - Abnormal; Notable for the following:       Result Value   Streptococcus, Group A Screen (Direct) POSITIVE (*)    All other components within normal limits    EKG  EKG Interpretation None       Radiology No results  found.  Procedures Procedures (including critical care time)  Medications Ordered in ED Medications  penicillin g benzathine (BICILLIN LA) 1200000 UNIT/2ML injection 1.2 Million Units (1.2 Million Units Intramuscular Given 10/15/16 0424)     Initial Impression / Assessment and Plan / ED Course  I have reviewed the triage vital signs and the nursing notes.  Pertinent labs & imaging results that were available during my care of the patient were reviewed by me and considered in my medical decision making (see chart for details).     Patient presents with sore throat x 4-5 days and is found to be strep positive. IM bicillin provided.   She is concerned about chest pain she has intermittently for the past several month. It is reproducible on exam. No risk factors for heart disease. Normal EKG. Discussed follow up for further management with her doctor.   Final Clinical Impressions(s) / ED Diagnoses   Final diagnoses:  None   1. Strep throat 2. Nonspecific chest pain  New Prescriptions New Prescriptions   No medications on file     Elpidio Anis, Cordelia Poche 10/15/16 1610    Zadie Rhine, MD 10/15/16 [phone]

## 2016-12-19 ENCOUNTER — Ambulatory Visit: Payer: Self-pay | Admitting: Internal Medicine

## 2016-12-25 ENCOUNTER — Telehealth: Payer: Self-pay | Admitting: Internal Medicine

## 2016-12-25 NOTE — Telephone Encounter (Signed)
Patient called stating still waiting to hear anything from Dental office. Patient had to go to [facility] for really bad inflammation on the tooth, has few antibiotic, not much. Still on pain with a little piece of the tooth because tooth broke in pieces. Wants an appt. To see Dr. Delrae AlfredMulberry

## 2016-12-25 NOTE — Telephone Encounter (Signed)
Please schedule acute visit for patient.

## 2016-12-26 ENCOUNTER — Ambulatory Visit (INDEPENDENT_AMBULATORY_CARE_PROVIDER_SITE_OTHER): Payer: Self-pay | Admitting: Internal Medicine

## 2016-12-26 ENCOUNTER — Encounter: Payer: Self-pay | Admitting: Internal Medicine

## 2016-12-26 VITALS — BP 118/78 | HR 76 | Resp 12 | Ht 62.5 in | Wt 167.0 lb

## 2016-12-26 DIAGNOSIS — K029 Dental caries, unspecified: Secondary | ICD-10-CM

## 2016-12-26 NOTE — Progress Notes (Signed)
   Subjective:    Patient ID: Autumn Benton, female    DOB: 06/18/1980, 36 y.o.   MRN: [id number]  HPI   Was seen at dentist 15 days ago for dental pain in bilateral molars, upper jaw.  Was told she needed dental extractions.  Was on ?Amoxicillin perhaps for 5-10 days.  Was on unknown medicine for inflammation for almost 1 month.  Perhaps ibuprofen.   No pain now.  Just needs to get an appt. With a dentist.    Current Meds  Medication Sig  . etonogestrel (NEXPLANON) 68 MG IMPL implant 1 each by Subdermal route once.  . naproxen (NAPROSYN) 500 MG tablet 1 tab by mouth twice daily with food for 2 weeks, then only twice daily as needed.    No Known Allergies    Review of Systems     Objective:   Physical Exam  NAD HEENT:  PERRL, EOMI, TMs pearly gray, throat without injection.  Dental decay/cavities scattered.  No gingival swelling or erythema. Neck:  Supple, No adenopathy Chest:  CTA CV: RRR without murmur or rub, radial pulses normal and equal        Assessment & Plan:  Dental decay:  Dental referral.

## 2017-01-03 ENCOUNTER — Emergency Department (HOSPITAL_COMMUNITY)
Admission: EM | Admit: 2017-01-03 | Discharge: 2017-01-03 | Disposition: A | Discharge status: Home / Self Care | Payer: No Typology Code available for payment source | Attending: Emergency Medicine | Admitting: Emergency Medicine

## 2017-01-03 ENCOUNTER — Encounter (HOSPITAL_COMMUNITY): Payer: Self-pay

## 2017-01-03 DIAGNOSIS — Z79899 Other long term (current) drug therapy: Secondary | ICD-10-CM | POA: Insufficient documentation

## 2017-01-03 DIAGNOSIS — H9202 Otalgia, left ear: Secondary | ICD-10-CM | POA: Insufficient documentation

## 2017-01-03 DIAGNOSIS — R42 Dizziness and giddiness: Secondary | ICD-10-CM

## 2017-01-03 LAB — URINALYSIS, ROUTINE W REFLEX MICROSCOPIC
Bacteria, UA: NONE SEEN
Bilirubin Urine: NEGATIVE
GLUCOSE, UA: NEGATIVE mg/dL
KETONES UR: NEGATIVE mg/dL
LEUKOCYTES UA: NEGATIVE
Nitrite: NEGATIVE
PH: 5 (ref 5.0–8.0)
Protein, ur: NEGATIVE mg/dL
SPECIFIC GRAVITY, URINE: 1.027 (ref 1.005–1.030)

## 2017-01-03 LAB — BASIC METABOLIC PANEL
Anion gap: 9 (ref 5–15)
BUN: 11 mg/dL (ref 6–20)
CALCIUM: 9.3 mg/dL (ref 8.9–10.3)
CO2: 21 mmol/L — ABNORMAL LOW (ref 22–32)
CREATININE: 0.71 mg/dL (ref 0.44–1.00)
Chloride: 108 mmol/L (ref 101–111)
GFR calc non Af Amer: >60 mL/min (ref >60)
Glucose, Bld: 146 mg/dL — ABNORMAL HIGH (ref 65–99)
Potassium: 3.5 mmol/L (ref 3.5–5.1)
SODIUM: 138 mmol/L (ref 135–145)

## 2017-01-03 LAB — CBC
HCT: 39.1 % (ref 36.0–46.0)
Hemoglobin: 13.4 g/dL (ref 12.0–15.0)
MCH: 30.4 pg (ref 26.0–34.0)
MCHC: 34.3 g/dL (ref 30.0–36.0)
MCV: 88.7 fL (ref 78.0–100.0)
PLATELETS: 210 10*3/uL (ref 150–400)
RBC: 4.41 MIL/uL (ref 3.87–5.11)
RDW: 12.5 % (ref 11.5–15.5)
WBC: 6.9 10*3/uL (ref 4.0–10.5)

## 2017-01-03 LAB — PREGNANCY, URINE: Preg Test, Ur: NEGATIVE

## 2017-01-03 MED ORDER — DIPHENHYDRAMINE HCL 50 MG/ML IJ SOLN
25.0000 mg | Freq: Once | INTRAMUSCULAR | Status: AC
  Administered 2017-01-03: 25 mg via INTRAVENOUS
  Filled 2017-01-03: qty 1

## 2017-01-03 MED ORDER — METOCLOPRAMIDE HCL 5 MG/ML IJ SOLN
10.0000 mg | Freq: Once | INTRAMUSCULAR | Status: AC
  Administered 2017-01-03: 10 mg via INTRAVENOUS
  Filled 2017-01-03: qty 2

## 2017-01-03 MED ORDER — SODIUM CHLORIDE 0.9 % IV BOLUS (SEPSIS)
500.0000 mL | Freq: Once | INTRAVENOUS | Status: AC
  Administered 2017-01-03: 500 mL via INTRAVENOUS

## 2017-01-03 NOTE — Discharge Instructions (Addendum)
Please call to schedule a follow up with your primary care doctor this week. Please return to the ER if you experience fevers >100.4, unexplained weight loss, vision or gait changes, return of dizziness, sudden or severe headaches, neck pain, chest pain, shortness of breath, abdominal pain, unable to tolerate food or fluids, diarrhea, extremity numbness/tingling/weakness, worsening symptoms, or any additional concerns.   Llame esta semana para programar un seguimiento con su mdico de atencin primaria. Por favor regrese a la sala de emergencias si experimenta fiebre> 100.4, prdida de peso inexplicable, cambios en la vista o la marcha, mareos repentinos o severos, dolor de cuello, dolor de pecho, dificultad para respirar, dolor abdominal, incapacidad para tolerar alimentos o lquidos, diarrea, entumecimiento / hormigueo / debilidad en las extremidades, empeoramiento de los sntomas o cualquier otra preocupacin adicional

## 2017-01-03 NOTE — ED Provider Notes (Signed)
[facility] Provider Note   CSN: [id number] Arrival date & time: 01/03/17  0111     History   Chief Complaint Chief Complaint  Patient presents with  . Otalgia  . Dizziness    HPI Autumn Benton is a 36 y.o. female with no past medical history presenting with left otalgia after feeling an insect and her ear earlier at work today. She further reports an episode of mild headache and dizziness which was worse when she went from sitting to standing up. She reports that the room felt like it was spinning around her and she became nauseated. Right now she does report a mild headache, dizziness and nausea with eye movement. Denies fever, chills, or other symptoms.  HPI  Past Medical History:  Diagnosis Date  . Arthritis 09/19/2016  . Chlamydia   . Miscarriage     Patient Active Problem List   Diagnosis Date Noted  . Arthritis 09/19/2016    History reviewed. No pertinent surgical history.  OB History    Gravida Para Term Preterm AB Living   7 6 6     6    SAB TAB Ectopic Multiple Live Births           1       Home Medications    Prior to Admission medications   Medication Sig Start Date End Date Taking? Authorizing Provider  etonogestrel (NEXPLANON) 68 MG IMPL implant 1 each by Subdermal route once.   Yes [provider]  cyclobenzaprine (FLEXERIL) 10 MG tablet 1/2 -1 tab by mouth at bedtime as needed Patient not taking: Reported on 12/26/2016 09/19/16   Julieanne Manson, MD  naproxen (NAPROSYN) 500 MG tablet 1 tab by mouth twice daily with food for 2 weeks, then only twice daily as needed. Patient not taking: Reported on 01/03/2017 09/19/16   Julieanne Manson, MD    Family History Family History  Problem Relation Age of Onset  . Alcohol abuse Father        had kidney and liver disease at one point related to ETOH intake, but patient states those resolved.    Social History Social History  Substance Use Topics  . Smoking status: Never  Smoker  . Smokeless tobacco: Never Used  . Alcohol use No     Allergies   Patient has no known allergies.   Review of Systems Review of Systems  Constitutional: Negative for chills and fever.  HENT: Positive for ear pain. Negative for ear discharge, facial swelling, sinus pain, sinus pressure, sore throat, tinnitus and trouble swallowing.   Eyes: Negative for photophobia, pain, redness and visual disturbance.  Respiratory: Negative for cough, choking, chest tightness, shortness of breath, wheezing and stridor.   Cardiovascular: Negative for chest pain and palpitations.  Gastrointestinal: Positive for nausea. Negative for abdominal pain and vomiting.  Genitourinary: Negative for difficulty urinating, dysuria and hematuria.  Musculoskeletal: Negative for back pain, gait problem, neck pain and neck stiffness.  Skin: Negative for color change, pallor and rash.  Neurological: Positive for dizziness and headaches. Negative for tremors, seizures, syncope, facial asymmetry, speech difficulty, weakness and numbness.     Physical Exam Updated Vital Signs BP 123/72   Pulse 71   Temp 98.6 F (37 C)   Resp 18   LMP  (LMP Unknown)   SpO2 98%   Physical Exam  Constitutional: She is oriented to person, place, and time. She appears well-developed and well-nourished. No distress.  Afebrile, nontoxic-appearing, sitting comfortably in bed in no  acute distress.  HENT:  Head: Normocephalic and atraumatic.  Right Ear: External ear normal.  Left Ear: External ear normal.  Mouth/Throat: Oropharynx is clear and moist. No oropharyngeal exudate.  Tympanic membranes are normal bilaterally without foreign body or insect  Eyes: Pupils are equal, round, and reactive to light. Conjunctivae and EOM are normal. Right eye exhibits no discharge. Left eye exhibits no discharge.  Neck: Normal range of motion. Neck supple.  Cardiovascular: Normal rate, regular rhythm, normal heart sounds and intact distal  pulses.   No murmur heard. Pulmonary/Chest: Effort normal and breath sounds normal. No respiratory distress. She has no wheezes. She has no rales.  Abdominal: She exhibits no distension.  Musculoskeletal: Normal range of motion. She exhibits no edema or deformity.  Neurological: She is alert and oriented to person, place, and time. No cranial nerve deficit or sensory deficit. She exhibits normal muscle tone. Coordination normal.  Neurologic Exam:  - Mental status: Patient is alert and cooperative. Fluent speech and words are clear. Coherent thought processes and insight is good. Patient is oriented x 4 to person, place, time and event.  - Cranial nerves:  CN III, IV, VI: pupils equally round, reactive to light both direct and conscensual. Full extra-ocular movement. CN V: motor temporalis and masseter strength intact. CN VII : muscles of facial expression intact. CN X :  midline uvula. XI strength of sternocleidomastoid and trapezius muscles 5/5, XII: tongue is midline when protruded. - Motor: No involuntary movements. Muscle tone and bulk normal throughout. Muscle strength is 5/5 in bilateral shoulder abduction, elbow flexion and extension, grip, hip extension, flexion, leg flexion and extension, ankle dorsiflexion and plantar flexion.  - Sensory: Proprioception, light tough sensation intact in all extremities.  - Cerebellar: rapid alternating movements and point to point movement intact in upper and lower extremities. Normal stance and gait.  Skin: Skin is warm and dry. No rash noted. She is not diaphoretic. No erythema. No pallor.  Psychiatric: She has a normal mood and affect.  Nursing note and vitals reviewed.    ED Treatments / Results  Labs (all labs ordered are listed, but only abnormal results are displayed) Labs Reviewed  BASIC METABOLIC PANEL - Abnormal; Notable for the following:       Result Value   CO2 21 (*)    Glucose, Bld 146 (*)    All other components within normal limits   URINALYSIS, ROUTINE W REFLEX MICROSCOPIC - Abnormal; Notable for the following:    Hgb urine dipstick SMALL (*)    Squamous Epithelial / LPF 0-5 (*)    All other components within normal limits  CBC  PREGNANCY, URINE    EKG  EKG Interpretation None       Radiology No results found.  Procedures Procedures (including critical care time)  Medications Ordered in ED Medications  sodium chloride 0.9 % bolus 500 mL (500 mLs Intravenous New Bag/Given 01/03/17 0631)  metoCLOPramide (REGLAN) injection 10 mg (10 mg Intravenous Given 01/03/17 0631)  diphenhydrAMINE (BENADRYL) injection 25 mg (25 mg Intravenous Given 01/03/17 0630)     Initial Impression / Assessment and Plan / ED Course  I have reviewed the triage vital signs and the nursing notes.  Pertinent labs & imaging results that were available during my care of the patient were reviewed by me and considered in my medical decision making (see chart for details).    Patient presents with main concern of left ear pain and sensation of foreign body after she  felt a fly go in her ear at work earlier. He also reports mild headache and associated dizziness and nausea/vomiting earlier today.  Reassuring exam, normal neuro, patient is ambulating without difficulty with normal stance and gait.  Going from sitting to standing position aggravate her symptoms as well as eye movement.  Ordered orthostatic vital signs Will give migraine cocktail and reassess  Transferred patient care at end of shift to Rogers Seedsobin Wojeck, NP pending completion of IV fluids and improvement of symptoms.  Anticipate discharge home with close PCP follow-up if symptoms improve after migraine cocktail.  Final Clinical Impressions(s) / ED Diagnoses   Final diagnoses:  Left ear pain  Dizziness    New Prescriptions New Prescriptions   No medications on file     Gregary CromerMitchell, Amadeus Oyama B, PA-C 01/03/17 0840    Azalia Bilisampos, Kevin, MD 01/04/17 [phone]

## 2017-01-03 NOTE — ED Provider Notes (Signed)
Sign out from Antony MaduraJ. Mitchell, PA-C. Patient presented to ED after feeling bug in ear, also reported headache, dizziness, and nausea. No acute neuro deficits. Tm intact bilaterally without erythema or effusion, no hemotympanum, external auditory canals normal. Concern for migraine.  Sign out plan: -Orthostatic VS -IVF, reglan, benadry -PO challenge  8:51 AM On re-eval, patient reports relief in headache, dizziness, and nausea. Tolerating PO. Ambulatory around pod with steady gait. My exam: Constitutional: Alert and oriented. Well appearing and in no respiratory apparent distress. Eyes: PERRL, EOMI, Conjunctivae normal ENT      Head: Normocephalic and atraumatic.      Ears: TM intact bilaterally without erythema or effusion, no hemotympanum, external ear canals normal.       Nose: No congestion.      Mouth/Throat: Mucous membranes are moist. Oropharynx without erythema or exudate. No trismus. Normal voice, handling secretions normally.      Neck: Supple, no nuchal signs, full active ROM of neck.  Cardiovascular: Normal S1 S2, regular rhythm, normal rate. Normal and symmetric distal pulses are present in all extremities. Respiratory: Breath sounds clear and equal bilaterally. No wheezes, rales, or rhonchi. Normal respiratory effort.  Gastrointestinal: Abdomen soft and nontender. No rebound or guarding. There is no CVA tenderness. Musculoskeletal: Spontaneously moving all extremities.  Neurologic: Speech clear. Alert and appropriate, no gross focal neurologic deficits are appreciated. Equal strength in all four extremities. Extremities neurovascularly intact.  Skin: Skin is warm, dry. Psychiatric: Mood and affect are normal. Speech and behavior are normal.  Orthostatic VS normal. Discussed results, discharge instructions, rx and safety, return precautions, and follow up using Engineer, structuralspanish translator. Pt verbalizes understanding using verbal teachback and agrees with plan, denies any additional concerns.       Thurman Sarver, Hinton Dyerobyn K, NP 01/07/17 1500    Azalia Bilisampos, Kevin, MD 01/08/17 [phone]

## 2017-01-03 NOTE — ED Triage Notes (Signed)
sts also had headache and now reporting dizziness and nausea.

## 2017-01-03 NOTE — ED Triage Notes (Signed)
sts was working felt a bug in her ear.none visualized in canal., reports pain in left ear.

## 2017-01-30 ENCOUNTER — Ambulatory Visit: Payer: Self-pay | Admitting: Internal Medicine

## 2017-03-06 ENCOUNTER — Ambulatory Visit: Payer: Self-pay | Admitting: Internal Medicine

## 2017-04-03 ENCOUNTER — Ambulatory Visit: Payer: Self-pay | Admitting: Internal Medicine

## 2017-06-05 ENCOUNTER — Ambulatory Visit: Payer: Self-pay | Admitting: Internal Medicine

## 2017-08-01 ENCOUNTER — Ambulatory Visit: Payer: Self-pay | Admitting: Internal Medicine

## 2017-08-01 ENCOUNTER — Encounter: Payer: Self-pay | Admitting: Internal Medicine

## 2017-08-01 VITALS — BP 124/82 | HR 80 | Resp 12 | Ht 62.5 in | Wt 175.0 lb

## 2017-08-01 DIAGNOSIS — M7701 Medial epicondylitis, right elbow: Secondary | ICD-10-CM

## 2017-08-01 DIAGNOSIS — G44209 Tension-type headache, unspecified, not intractable: Secondary | ICD-10-CM

## 2017-08-01 DIAGNOSIS — L83 Acanthosis nigricans: Secondary | ICD-10-CM

## 2017-08-01 DIAGNOSIS — R739 Hyperglycemia, unspecified: Secondary | ICD-10-CM

## 2017-08-01 MED ORDER — IBUPROFEN 200 MG PO TABS: ORAL_TABLET | ORAL | 0 refills | Status: DC

## 2017-08-01 NOTE — Progress Notes (Signed)
   Subjective:    Patient ID: Autumn ForsterEdith Camacho Patricio, female    DOB: 09/16/1980, 37 y.o.   MRN: [id number]  HPI   Interpreted  1.  Right frontal headaches for past 2 months:  Difficulty describing the character of the pain.  Maybe like someone is punching her in the head.   Her pain can radiate to temporal-parietal area and then down her neck to her right arm.  The radiation only occurs with more severe headaches.  She can also have her right hand go to sleep with the headache and radiation of pain. She states she has had the headache continuously for the past 2 months.  The pain has never gone away completely. Patient also feels a pain she is having at her medial right elbow is the instigator of her headaches--it started first and then her headaches and right arm symptoms seemed to follow. She denies photophobia or phonophobia.  No associated nausea and vomiting.  She has had vertigo mildly with headache.   She has not had headaches quite like this before.  Was seen about the same time last year diagnosed with tension headache for which she went to PT with resolution.  She stopped doing the exercises given as her right arm hurt when performing them. No family history of headaches. Did not like taking Cyclobenzaprine or Naproxen with her headaches previously.  Did not help and did not like the way she felt with the meds. Has take Ibuprofen with some help  2.  Elevated blood sugar in August of 2018 in ED.  She states she did not know her sugar was elevated. No family history of diabetes.    Current Meds  Medication Sig  . etonogestrel (NEXPLANON) 68 MG IMPL implant 1 each by Subdermal route once.   No Known Allergies  Review of Systems     Objective:   Physical Exam  NAD HEENT:  PERRL, EOMI, Discs sharp,TMs pearly gray, throat without injection. Neck:  Supple, No adenopathy.NT over cervical spinous processes, but quite tender over right trap and paraspinous musculature to nuchal  ridge. Chest:  CTA CV:  RRR with normal S1 and S2, No S3, S4 or murmur.  Radial and DP pulses normal and equal Neuro:  A & O x 3, CN II-XII grossly intact.  DTRs 2+/4 and Motor 5/5 throughout.  Sensory equal bilaterally to light touch of UE.  Gait normal. Coordination normal. MS:  Tender over right medial epicondyle Skin:  Acanthosis nigricans at posterior neck.         Assessment & Plan:  1.  Right neck and headache pain possibly with radiculopathy.   Does not want muscle relaxant.   Ibuprofen 600 mg twice daily with food [facility].  2.  Right Medial epicondylitis:  Ibuprofen and counter pressure arm band. PT referral. To pay attention at work what movements exacerbate her pain  3.  Hyperglycemia with acanthosis nigricans:  BMP and A1C.

## 2017-08-02 LAB — BASIC METABOLIC PANEL
BUN/Creatinine Ratio: 10 (ref 9–23)
BUN: 7 mg/dL (ref 6–20)
CHLORIDE: 106 mmol/L (ref 96–106)
CO2: 21 mmol/L (ref 20–29)
Calcium: 9.2 mg/dL (ref 8.7–10.2)
Creatinine, Ser: 0.71 mg/dL (ref 0.57–1.00)
GFR, EST AFRICAN AMERICAN: 125 mL/min/{1.73_m2} (ref >59)
GFR, EST NON AFRICAN AMERICAN: 108 mL/min/{1.73_m2} (ref >59)
Glucose: 104 mg/dL — ABNORMAL HIGH (ref 65–99)
Potassium: 4 mmol/L (ref 3.5–5.2)
Sodium: 143 mmol/L (ref 134–144)

## 2017-08-02 LAB — HGB A1C W/O EAG: Hgb A1c MFr Bld: 6.2 % — ABNORMAL HIGH (ref 4.8–5.6)

## 2017-08-29 LAB — POCT LIPID PANEL
HDL: 39
LDL: 144
TC: 202
TRG: 93

## 2017-08-29 LAB — GLUCOSE, POCT (MANUAL RESULT ENTRY): POC Glucose: 97 mg/dl (ref 70–99)

## 2017-10-30 ENCOUNTER — Encounter: Payer: Self-pay | Admitting: Internal Medicine

## 2017-10-30 ENCOUNTER — Ambulatory Visit: Payer: Self-pay | Admitting: Internal Medicine

## 2017-10-30 VITALS — BP 118/82 | HR 88 | Resp 12 | Ht 62.5 in | Wt 177.0 lb

## 2017-10-30 DIAGNOSIS — G44209 Tension-type headache, unspecified, not intractable: Secondary | ICD-10-CM

## 2017-10-30 DIAGNOSIS — L918 Other hypertrophic disorders of the skin: Secondary | ICD-10-CM

## 2017-10-30 DIAGNOSIS — M7701 Medial epicondylitis, right elbow: Secondary | ICD-10-CM

## 2017-10-30 DIAGNOSIS — R7303 Prediabetes: Secondary | ICD-10-CM

## 2017-10-30 NOTE — Progress Notes (Signed)
   Subjective:    Patient ID: Autumn ForsterEdith Camacho Benton, female    DOB: 04/28/1981, 37 y.o.   MRN: [id number]  HPI   1.  Right neck and headache pain with possibly radiculopathy:  Went to PT 4 times. She does have a home exercise program.  She is performing this 4 times daily and is helping significantly with neck and headache pain.    2.  Right medial epicondylitis:  States this is improved as well with PT.  3.  Right lower back pain about 1 week ago and had small amount of vaginal bleeding.  Went to clinic at [facility] and had full gynecologic evaluation.  She does have Nexplanon through the clinic there.  She generally does not have blood flow with her periods, but can have cramping like she will have a period, but no flow.  This was similar to what she had last week, but more than usual.  Her work up was reportedly normal and was felt to have a period.  She is planning to have the implant removed next year as she does not like how she feels with the implant.    4.  Concerns regarding skin tags:  Would like to have skin tags removed.  Applies lime and garlic for some reason to the area, she feels this keeps her from scratching around the skin tags--states it itches.  States she scratched off one from her glabellar area.  [facility] was told she had infected the skin tags from scratching at them.  Was told she should go to Derm to have them removed with laser.  5.  Obesity and prediabetes:  States she is trying to be physically active since last visit.  45-60 minutes 4 days weekly.   A1C in March was 6.2%.   Was working at [employer] and eating poorly from menu there.  She states she has stopped all the shakes, etc.    11 a.m.:  Coffee with milk and white bread.    1 p.m.:  Omelet with ham and cheese.    Water.  6-7 p.m.:  Quarter pounder Hamburger, salad and water.    Has gained 10 lbs since August of 2018.    Current Meds  Medication Sig  . etonogestrel (NEXPLANON) 68 MG IMPL  implant 1 each by Subdermal route once.  Marland Kitchen. ibuprofen (ADVIL,MOTRIN) 200 MG tablet 3 tabs by mouth twice daily with meals    No Known Allergies       Review of Systems     Objective:   Physical Exam NAD Neck:  NT, full ROM without discomfort. Multiple skin tags in different sizes and stages at neck base and on high chest Chest:  CTA CV:  RRR without murmur or rub.  Radial and DP pulses normal and equal.       Assessment & Plan:  1.  Tension Headache and right cervical radiculopathy:  Much improved with physical therapy.  To continue home exercise program.  2.  Right Medial Epicondylitis:  As above.  3.  Perimenstrual pain:  [facility]  4.  Skin Tags:  To return next available to have larger removed.  5.  Obesity and prediabetes:  Has made some changes, but diet still not terrific.  To work on less in way of processed foods, particularly breads, and significant increase of vegetables.  Long discussion regarding this today.  Follow up 3 months otherwise for weight and lifestyle changes--will obtain A1C then.

## 2017-10-30 NOTE — Patient Instructions (Signed)

## 2017-11-25 ENCOUNTER — Ambulatory Visit: Payer: Self-pay | Admitting: Internal Medicine

## 2018-01-29 ENCOUNTER — Encounter: Payer: Self-pay | Admitting: Internal Medicine

## 2018-01-29 ENCOUNTER — Ambulatory Visit: Payer: Self-pay | Admitting: Internal Medicine

## 2018-01-29 VITALS — BP 118/80 | HR 78 | Resp 12 | Ht 62.5 in | Wt 174.0 lb

## 2018-01-29 DIAGNOSIS — L918 Other hypertrophic disorders of the skin: Secondary | ICD-10-CM

## 2018-01-29 DIAGNOSIS — R7303 Prediabetes: Secondary | ICD-10-CM

## 2018-01-29 NOTE — Progress Notes (Signed)
   Subjective:    Patient ID: Autumn Benton, female    DOB: 1980-12-16, 37 y.o.   MRN: [id number]  HPI   1.  Prediabetes:  Has been walking more and eating better.  Has lost about 3 lbs from last visit in June.  Walks daily.  Doing more errands on foot rather than driving.   As before, drinking fewer high calorie and sweet drinks and drinking more water.  Eating better.    2.  Skin tags on neck:  Did not come in for separate visit to have removed--states did not have an intepreter  Current Meds  Medication Sig  . etonogestrel (NEXPLANON) 68 MG IMPL implant 1 each by Subdermal route once.  Marland Kitchen ibuprofen (ADVIL,MOTRIN) 200 MG tablet 3 tabs by mouth twice daily with meals    No Known Allergies Review of Systems     Objective:   Physical Exam NAD Neck: multiple skin tags of varying sizes Lungs:  CTA CV:  RRR LE:  No edema       Assessment & Plan:  1.  Prediabetes and obesity:  Continue to work on lifestyle changes.  A1C today.  2. Skin tags:  Reschedule visit for removal. Follow up in 3 months for #1

## 2018-01-30 LAB — HGB A1C W/O EAG: HEMOGLOBIN A1C: 5.9 % — AB (ref 4.8–5.6)

## 2018-02-27 ENCOUNTER — Ambulatory Visit: Payer: Self-pay | Admitting: Internal Medicine

## 2018-02-27 ENCOUNTER — Encounter: Payer: Self-pay | Admitting: Internal Medicine

## 2018-02-27 VITALS — BP 122/82 | HR 78 | Resp 12 | Ht 62.5 in | Wt 172.0 lb

## 2018-02-27 DIAGNOSIS — L918 Other hypertrophic disorders of the skin: Secondary | ICD-10-CM

## 2018-02-27 NOTE — Progress Notes (Signed)
Here to have multiple skin tags removed from neck and chest area.  Informed consent obtained and signed.  Procedure:   Entire neck and anterior left axilla cleaned in sterile fashion. 3 ml of 1% lidocaine with epi injected in small amounts at base of the 40 skin tags removed.  Sharp excision made at base of each skin tag. Good hemostasis. Patient tolerated well without complication.  A/P:  Skin tag removal  #40 To call for bleeding, redness, swelling from any of lesion removal areas. To try and keep covered as best as possible while healing.  Apply bacitracin daily.

## 2018-02-27 NOTE — Patient Instructions (Signed)
Hable clinica si tiene problema con piel

## 2018-04-30 ENCOUNTER — Ambulatory Visit: Payer: Self-pay | Admitting: Internal Medicine

## 2018-06-25 ENCOUNTER — Ambulatory Visit: Payer: Self-pay | Admitting: Internal Medicine

## 2018-06-25 ENCOUNTER — Telehealth: Payer: Self-pay | Admitting: Internal Medicine

## 2018-06-25 ENCOUNTER — Encounter: Payer: Self-pay | Admitting: Internal Medicine

## 2018-06-25 VITALS — BP 122/70 | HR 62 | Resp 12 | Ht 62.5 in | Wt 171.0 lb

## 2018-06-25 DIAGNOSIS — R7303 Prediabetes: Secondary | ICD-10-CM

## 2018-06-25 DIAGNOSIS — E669 Obesity, unspecified: Secondary | ICD-10-CM

## 2018-06-25 DIAGNOSIS — K0889 Other specified disorders of teeth and supporting structures: Secondary | ICD-10-CM

## 2018-06-25 DIAGNOSIS — Z6830 Body mass index (BMI) 30.0-30.9, adult: Secondary | ICD-10-CM

## 2018-06-25 DIAGNOSIS — E785 Hyperlipidemia, unspecified: Secondary | ICD-10-CM

## 2018-06-25 NOTE — Telephone Encounter (Signed)
Patient was called to be notified regarding procedure to remove tag on eyelid cannot be done here and patient requested a referral from Dr. Delrae Alfred  to be sent to another facility or Doctor where she can get it done at.  Please advise.

## 2018-06-25 NOTE — Telephone Encounter (Signed)
To Dr. Mulberry for further direction 

## 2018-06-25 NOTE — Progress Notes (Signed)
    Subjective:    Patient ID: Autumn Benton, female   DOB: 06-Jul-1980, 38 y.o.   MRN: [id number]   HPI   1.  Obesity and Prediabetes:  She is involved with regular walking and prediabetes.  Has stopped eating at [facility].  A1C dropped to 5.9% in September. She feels she has made significant changes in lifestyle even since September. She has not lost a lot of weight, but her clothes are looser on her.  2.  Dyslipidemia:  HDL low at 39 a little less than one year ago.  She had milk in her coffee this morning.   3.  Left eye infection about 1 month ago.  Could not keep her appt here as did not have transportation that day and went to another clinic.  Went to the "[place] [facility]" on [address]. She was on unknown antibiotic once daily for a week.  Took care of the problem.  4.  Left upper sternal chest pain:  Has point tenderness at about the 2nd rib level when she starts exercising.  After a few minutes as she continues to exercise, the discomfort goes away.   She does not really stretch out before exercising.  Current Meds  Medication Sig  . etonogestrel (NEXPLANON) 68 MG IMPL implant 1 each by Subdermal route once.  Marland Kitchen ibuprofen (ADVIL,MOTRIN) 200 MG tablet 3 tabs by mouth twice daily with meals   No Known Allergies   Review of Systems    Objective:   BP 122/70 (BP Location: Left Arm, Patient Position: Sitting, Cuff Size: Normal)   Pulse 62   Resp 12   Ht 5' 2.5" (1.588 m)   Wt 171 lb (77.6 kg)   LMP  (LMP Unknown)   BMI 30.78 kg/m   Physical Exam   NAD HEENT:  PERRL, EOMI, No injection or swelling of eyes.   Right posterior molar with blackening in center of bite surface.  No surrounding erythema.  Tender to palpation. Neck:  Supple, No adenopathy Chest:  CTA.  Tender over 2nd and 3rd Costochondral joints.  Reproduces her chest pain CV:  RRR with normal S1 and S2, No S3, S4 or murmur.  Radial and DP pulses normal and equal LE;  No edema  Assessment & Plan     1.  Obesity and Prediabetes:  Has improved significantly with lifestyle changes.  A1C today.    2.  Dyslipidemia:  Recheck LP today--not quite fasting with milk in coffee.  3.  Chest Pain:  Musculoskeletal/costochondritis:  Discussed stretches to perform before and after exercise.  Ibuprofen as needed for particularly bad days.  4.  Tooth pain/decay:  Re refer back to dental clinic.  5.  Recent eye infection:  Resolved.  Discussed need to inform front desk of acute problem when asking for an appt.

## 2018-06-26 LAB — LIPID PANEL W/O CHOL/HDL RATIO
CHOLESTEROL TOTAL: 188 mg/dL (ref 100–199)
HDL: 40 mg/dL (ref >39)
LDL CALC: 131 mg/dL — AB (ref 0–99)
TRIGLYCERIDES: 85 mg/dL (ref 0–149)
VLDL Cholesterol Cal: 17 mg/dL (ref 5–40)

## 2018-06-26 LAB — HGB A1C W/O EAG: Hgb A1c MFr Bld: 5.7 % — ABNORMAL HIGH (ref 4.8–5.6)

## 2018-07-23 NOTE — Telephone Encounter (Signed)
I don't know that we can get her in for removal of skin tag to a specialist.  When see her back will take another look at what she is describing.

## 2018-07-24 ENCOUNTER — Encounter: Payer: Self-pay | Admitting: Internal Medicine

## 2018-07-24 ENCOUNTER — Ambulatory Visit: Payer: Self-pay | Admitting: Internal Medicine

## 2018-07-24 VITALS — BP 122/80 | HR 68 | Resp 12 | Ht 62.5 in | Wt 170.0 lb

## 2018-07-24 DIAGNOSIS — L918 Other hypertrophic disorders of the skin: Secondary | ICD-10-CM

## 2018-07-24 NOTE — Progress Notes (Signed)
    Subjective:    Patient ID: Autumn Benton, female   DOB: 1980/07/01, 38 y.o.   MRN: [id number]   HPI   Feels she is getting an infection in her left eye due to a skin tag there.  States she scratches the skin tag on the lower mid lid margin and then the eye itself gets red and itchy.  She at times will get discharge from her eye--was green.   Has been using Terramycin ophthalmic ointment.  Treats herself once daily only until irritation is gone.  Last treatment was only for 2 days.  Purchase at [facility]    Current Meds  Medication Sig  . etonogestrel (NEXPLANON) 68 MG IMPL implant 1 each by Subdermal route once.  . Multiple Vitamin (MULTIVITAMIN) tablet Take 1 tablet by mouth daily.   No Known Allergies   Review of Systems    Objective:   BP 122/80 (BP Location: Left Arm, Patient Position: Sitting, Cuff Size: Normal)   Pulse 68   Resp 12   Ht 5' 2.5" (1.588 m)   Wt 170 lb (77.1 kg)   LMP  (LMP Unknown)   BMI 30.60 kg/m   Physical Exam  3-4 mm 3 part skin tag/warty lesion hyperpigmented on a single smaller base hanging from mid left lower lid margin.  No erythema, discharge from eye or lid currently   Assessment & Plan  1.  Left Eyelid skin tag:  Causing issues with recurrent inflammation of eyelid and eye and concerned will eventually cause issues with eye closure, etc. Check with derm vs ophthalmology through [facility] for removal for problems with vision and recurrent infection.  Call into [facility] on how to proceed. To clean lid margin with 1-2 drops baby shampoo in clean warm water--dip clean cloth and gently wash lid margin twice daily.

## 2018-07-24 NOTE — Addendum Note (Signed)
Addended by: Marcene Duos on: 07/24/2018 11:17 AM   Modules accepted: Orders

## 2018-10-01 ENCOUNTER — Other Ambulatory Visit: Payer: Self-pay

## 2018-10-01 ENCOUNTER — Ambulatory Visit (INDEPENDENT_AMBULATORY_CARE_PROVIDER_SITE_OTHER): Payer: Self-pay | Admitting: Internal Medicine

## 2018-10-01 ENCOUNTER — Encounter: Payer: Self-pay | Admitting: Internal Medicine

## 2018-10-01 VITALS — BP 124/80 | HR 78 | Resp 12 | Ht 62.5 in | Wt 173.0 lb

## 2018-10-01 DIAGNOSIS — R2232 Localized swelling, mass and lump, left upper limb: Secondary | ICD-10-CM | POA: Insufficient documentation

## 2018-10-01 NOTE — Progress Notes (Signed)
    Subjective:    Patient ID: Autumn Benton, female   DOB: 1980/12/25, 38 y.o.   MRN: [id number]   HPI  Interpreted by Trinda Pascal  Developed swelling of left upper arm, just below insertion of deltoid on humerus at anterolateral aspect about 1 week ago.  A couple of days later, developed pain with mainly abduction and flexion.   No history of bumping against anything or any other injury.   No bruising or discoloration of overlying skin has been noted Otherwise feeling well.    Current Meds  Medication Sig  . etonogestrel (NEXPLANON) 68 MG IMPL implant 1 each by Subdermal route once.  Marland Kitchen ibuprofen (ADVIL,MOTRIN) 200 MG tablet 3 tabs by mouth twice daily with meals   No Known Allergies   Review of Systems    Objective:   BP 124/80 (BP Location: Right Arm, Patient Position: Sitting, Cuff Size: Normal)   Pulse 78   Resp 12   Ht 5' 2.5" (1.588 m)   Wt 173 lb (78.5 kg)   LMP  (LMP Unknown)   BMI 31.14 kg/m   Physical Exam  Pain on movement of left upper arm in just with any movement--mild to moderate. Palpable firm mass angling across from lateral to anterior upper arm just under deltoid insertion.   No fluctuance. No overlying bruising or erythema.   Assessment & Plan   Left upper arm mass:  With no history of injury, concern for need to visualize the soft tissue area, though could be muscle spasm. CBC, CMP, MR of area if possible. Ibuprofen 400-800 mg every 6 hours as needed for pain with food.

## 2018-10-01 NOTE — Patient Instructions (Signed)
Ibuprofen 400-800 mg cada 6 horas a necesita dolor--siempre con de comida.

## 2018-10-02 ENCOUNTER — Telehealth: Payer: Self-pay | Admitting: Internal Medicine

## 2018-10-02 DIAGNOSIS — L918 Other hypertrophic disorders of the skin: Secondary | ICD-10-CM

## 2018-10-02 LAB — CBC WITH DIFFERENTIAL/PLATELET
Basophils Absolute: 0.1 10*3/uL (ref 0.0–0.2)
Basos: 1 %
EOS (ABSOLUTE): 0.2 10*3/uL (ref 0.0–0.4)
Eos: 3 %
Hematocrit: 40.6 % (ref 34.0–46.6)
Hemoglobin: 14 g/dL (ref 11.1–15.9)
Immature Grans (Abs): 0 10*3/uL (ref 0.0–0.1)
Immature Granulocytes: 0 %
Lymphocytes Absolute: 1.8 10*3/uL (ref 0.7–3.1)
Lymphs: 29 %
MCH: 30.6 pg (ref 26.6–33.0)
MCHC: 34.5 g/dL (ref 31.5–35.7)
MCV: 89 fL (ref 79–97)
Monocytes Absolute: 0.4 10*3/uL (ref 0.1–0.9)
Monocytes: 7 %
Neutrophils Absolute: 3.7 10*3/uL (ref 1.4–7.0)
Neutrophils: 60 %
Platelets: 206 10*3/uL (ref 150–450)
RBC: 4.57 x10E6/uL (ref 3.77–5.28)
RDW: 12.2 % (ref 11.7–15.4)
WBC: 6.2 10*3/uL (ref 3.4–10.8)

## 2018-10-02 LAB — COMPREHENSIVE METABOLIC PANEL
ALT: 22 IU/L (ref 0–32)
AST: 16 IU/L (ref 0–40)
Albumin/Globulin Ratio: 1.7 (ref 1.2–2.2)
Albumin: 4.5 g/dL (ref 3.8–4.8)
Alkaline Phosphatase: 62 IU/L (ref 39–117)
BUN/Creatinine Ratio: 16 (ref 9–23)
BUN: 11 mg/dL (ref 6–24)
Bilirubin Total: 1 mg/dL (ref 0.0–1.2)
CO2: 21 mmol/L (ref 20–29)
Calcium: 9.3 mg/dL (ref 8.7–10.2)
Chloride: 106 mmol/L (ref 96–106)
Creatinine, Ser: 0.7 mg/dL (ref 0.57–1.00)
GFR calc Af Amer: 125 mL/min/{1.73_m2} (ref >59)
GFR calc non Af Amer: 109 mL/min/{1.73_m2} (ref >59)
Globulin, Total: 2.7 g/dL (ref 1.5–4.5)
Glucose: 89 mg/dL (ref 65–99)
Potassium: 3.8 mmol/L (ref 3.5–5.2)
Sodium: 141 mmol/L (ref 134–144)
Total Protein: 7.2 g/dL (ref 6.0–8.5)

## 2018-10-02 NOTE — Telephone Encounter (Signed)
Per Kary Kos at [facility] they need a  Dermatology Referral for this patient to try to see if a dermatologist can remove 3 pronged skin tag with base on left lower lid/eyelash margin. Last month we sent out an Ophthalmologist  Referral, came back because ophthalmology can not perform skin tag removals. Please send dermatology referral with information listed above.  To Dr. Delrae Alfred to order referral.

## 2018-10-22 ENCOUNTER — Ambulatory Visit: Payer: Self-pay | Admitting: Internal Medicine

## 2018-10-22 ENCOUNTER — Other Ambulatory Visit: Payer: Self-pay

## 2018-10-22 ENCOUNTER — Encounter: Payer: Self-pay | Admitting: Internal Medicine

## 2018-10-22 VITALS — BP 126/84 | HR 68 | Resp 12 | Ht 62.5 in | Wt 176.0 lb

## 2018-10-22 DIAGNOSIS — E669 Obesity, unspecified: Secondary | ICD-10-CM

## 2018-10-22 DIAGNOSIS — Z124 Encounter for screening for malignant neoplasm of cervix: Secondary | ICD-10-CM

## 2018-10-22 DIAGNOSIS — Z6830 Body mass index (BMI) 30.0-30.9, adult: Secondary | ICD-10-CM

## 2018-10-22 DIAGNOSIS — Z0000 Encounter for general adult medical examination without abnormal findings: Secondary | ICD-10-CM

## 2018-10-22 DIAGNOSIS — R7303 Prediabetes: Secondary | ICD-10-CM

## 2018-10-22 DIAGNOSIS — N644 Mastodynia: Secondary | ICD-10-CM

## 2018-10-22 DIAGNOSIS — E785 Hyperlipidemia, unspecified: Secondary | ICD-10-CM

## 2018-10-22 DIAGNOSIS — E66811 Obesity, class 1: Secondary | ICD-10-CM

## 2018-10-22 NOTE — Patient Instructions (Addendum)
Tome un vaso de agua antes de cada comida Tome un minimo de 6 a 8 vasos de agua diarios Coma tres veces al dia Coma una proteina y Neomia Dear grasa saludable con comida.  (huevos, pescado, pollo, pavo, y limite carnes rojas Coma 5 porciones diarias de legumbres.  Mezcle los colores Coma 2 porciones diarias de frutas con cascara cuando sea comestible Use platos pequeos Suelte su tenedor o cuchara despues de cada mordida hata que se mastique y se trague Come en la mesa con amigos o familiares por lo menos una vez al dia Apague la televisin y aparatos electrnicos durante la comida  Su objetivo debe ser perder una libra por semana  Estudios recientes indican que las personas quienes consumen todos de sus calorias durante 12 horas se bajan de pesocon Mas eficiencia.  Por ejemplo, si Usted come su primera comida a las 7:00 a.m., su comida final del dia se debe completar antes de las 7:00 p.m.  [facility] [employer]  Address: [facility], [address] [place], [place] [address]  Phone: [phone]  Hours: Open  Closes 4:30

## 2018-10-22 NOTE — Progress Notes (Signed)
Subjective:    Patient ID: Autumn Benton, female   DOB: 12/17/1980, 38 y.o.   MRN: [id number]   HPI   Interpreted by Trinda PascalNicky Bello  CPE with pap  1.  Pap:  Last check 6 years ago.  Always normal.  No family history of cervical cancer.  2.  Mammogram:  Never.  No family history of breast cancer.  Has had breast pain intermittently since Nexplanon first place 6 years ago.  Had a second placement in 04/2017.  3.  Osteoprevention:  Has one serving of milk daily.  If she drinks more, she has GI upset.  She used to take Calcium and Vitamin D daily, states it made her dizzy, so she stopped. She is willing to try a serving of cheese, a second of yogurt and continue her milk. Not being physically active.     4.  Guaiac Cards:  Never.  5.  Colonoscopy:  Never.  No family history of colon cancer.  6.  Immunizations:  Immunization History  Administered Date(s) Administered  . Influenza,inj,Quad PF,6+ Mos 08/29/2017  . Tdap 04/08/2013     7.  Glucose/Cholesterol:  History of prediabetes.  Last A1C was down to 5.7% in January 2020.  Current Meds  Medication Sig  . CALCIUM-MAGNESIUM-VITAMIN D PO Take by mouth daily.  Marland Kitchen. etonogestrel (NEXPLANON) 68 MG IMPL implant 1 each by Subdermal route once.  Marland Kitchen. ibuprofen (ADVIL,MOTRIN) 200 MG tablet 3 tabs by mouth twice daily with meals  . Multiple Vitamin (MULTIVITAMIN) tablet Take 1 tablet by mouth daily.   No Known Allergies  Past Medical History:  Diagnosis Date  . Arthritis 09/19/2016  . Chlamydia   . Dyslipidemia (high LDL; low HDL)   . Miscarriage   . Prediabetes     History reviewed. No pertinent surgical history.  Family History  Problem Relation Age of Onset  . Alcohol abuse Father        had kidney and liver disease at one point related to ETOH intake, but patient states those resolved.    Social History   Socioeconomic History  . Marital status: Married    Spouse name: Jesus GeneraRafael Alonzo Vargas  . Number of  children: 6  . Years of education: 6  . Highest education level: Not on file  Occupational History  . Occupation: [employer]  Social Needs  . Financial resource strain: Hard  . Food insecurity:    Worry: Sometimes true    Inability: Sometimes true  . Transportation needs:    Medical: No    Non-medical: No  Tobacco Use  . Smoking status: Never Smoker  . Smokeless tobacco: Never Used  Substance and Sexual Activity  . Alcohol use: No  . Drug use: No  . Sexual activity: Yes    Birth control/protection: Implant  Lifestyle  . Physical activity:    Days per week: 0 days    Minutes per session: 0 min  . Stress: Very much  Relationships  . Social connections:    Talks on phone: More than three times a week    Gets together: Never    Attends religious service: Never    Active member of club or organization: No    Attends meetings of clubs or organizations: Never    Relationship status: Married  . Intimate partner violence:    Fear of current or ex partner: No    Emotionally abused: No    Physically abused: No    Forced sexual activity: No  Other Topics Concern  . Not on file  Social History Narrative      Patient just back to work with limited hours 10/22/2018 due to pandemic   Her husband has been out of work as well.   Having difficulties paying for food and rent currently.   Lives at home with husband and 3 youngest children.     Review of Systems   Denies problems today. Still with skin horn on left lower eyelid and mass, right upper arm we are waiting for referrals on.    Objective:   BP 126/84 (BP Location: Left Arm, Patient Position: Sitting, Cuff Size: Normal)   Pulse 68   Resp 12   Ht 5' 2.5" (1.588 m)   Wt 176 lb (79.8 kg)   LMP  (LMP Unknown)   BMI 31.68 kg/m   Physical Exam  Constitutional: She is oriented to person, place, and time. She appears well-developed and well-nourished.  HENT:  Head: Normocephalic and atraumatic.  Right Ear: Hearing,  tympanic membrane, external ear and ear canal normal.  Left Ear: Hearing, tympanic membrane, external ear and ear canal normal.  Nose: Nose normal.  Mouth/Throat: Uvula is midline, oropharynx is clear and moist and mucous membranes are normal.  Eyes: Pupils are equal, round, and reactive to light. Conjunctivae and EOM are normal.  Discs sharp bilaterally  Neck: Normal range of motion and full passive range of motion without pain. Neck supple. No thyromegaly present.  Cardiovascular: Normal rate, regular rhythm, S1 normal and S2 normal. Exam reveals no S3, no S4 and no friction rub.  No murmur heard. No carotid bruits.  Carotid, radial, femoral, DP and PT pulses normal and equal.   Pulmonary/Chest: Effort normal and breath sounds normal. Right breast exhibits tenderness (Superior lateral area in particular). Right breast exhibits no inverted nipple, no mass, no nipple discharge and no skin change. Left breast exhibits tenderness (Superior lateral area in particular.). Left breast exhibits no inverted nipple, no mass, no nipple discharge and no skin change.  Abdominal: Soft. Bowel sounds are normal. She exhibits no mass. There is no hepatosplenomegaly. There is no abdominal tenderness. No hernia.  Genitourinary:    Genitourinary Comments: Normal external female genitalia. No cervical lesions or inflammation. Normal appearing vaginal lubrication No uterine or adnexal mass or tenderness.   Musculoskeletal: Normal range of motion.  Lymphadenopathy:       Head (right side): No submental and no submandibular adenopathy present.       Head (left side): No submental and no submandibular adenopathy present.    She has no cervical adenopathy.    She has no axillary adenopathy.       Right: No inguinal and no supraclavicular adenopathy present.       Left: No inguinal and no supraclavicular adenopathy present.  Neurological: She is alert and oriented to person, place, and time. She has normal strength  and normal reflexes. No cranial nerve deficit or sensory deficit. Coordination and gait normal.  Skin: Skin is warm. No rash noted.  Psychiatric: She has a normal mood and affect. Her speech is normal and behavior is normal. Judgment and thought content normal. Cognition and memory are normal.     Assessment & Plan   1.  CPE with pap Mammogram scheduled--diagnostic due to breast discomfort.  2.  Dyslipidemia:  Recheck in 4 months after back to physical activity regularly and eating healthier.  Less physical activity in last couple of months due to pandemic.  3.  Prediabetes:  Improved earlier in year.  4.  Financial issues with pandemic:  Given $50 of [employer] today for large family. Discussed at length to have discussion with her landlord what to do regarding her rent as the restrictions on evictions are lifted. They were only able to pay half of their rent this past month. Discussed contact info for [facility] and Legal Aid if needed.

## 2018-10-27 LAB — CYTOLOGY - PAP

## 2018-11-18 DIAGNOSIS — E669 Obesity, unspecified: Secondary | ICD-10-CM | POA: Insufficient documentation

## 2018-11-18 MED ORDER — METRONIDAZOLE 500 MG PO TABS: ORAL_TABLET | ORAL | 0 refills | Status: DC

## 2018-11-18 MED ORDER — FLUCONAZOLE 150 MG PO TABS: ORAL_TABLET | ORAL | 0 refills | Status: DC

## 2018-11-23 ENCOUNTER — Telehealth: Payer: Self-pay | Admitting: Internal Medicine

## 2018-11-23 ENCOUNTER — Other Ambulatory Visit: Payer: Self-pay

## 2018-11-23 MED ORDER — METRONIDAZOLE 500 MG PO TABS: ORAL_TABLET | ORAL | 0 refills | Status: DC

## 2018-11-23 MED ORDER — FLUCONAZOLE 150 MG PO TABS: ORAL_TABLET | ORAL | 0 refills | Status: DC

## 2018-11-23 NOTE — Telephone Encounter (Signed)
Patient informed. 

## 2018-11-23 NOTE — Telephone Encounter (Signed)
Patient called requesting Metronidazole 500 mg and Fluconazole 150 Mg to be sent to [facility]; patient's oc is expired and cannot get it from there.  Please advise.

## 2018-11-23 NOTE — Telephone Encounter (Signed)
Please notify Rx sent to pharmacy

## 2019-01-15 ENCOUNTER — Telehealth: Payer: Self-pay

## 2019-01-15 NOTE — Telephone Encounter (Signed)
Patient called this morning stating she was having a headache for 2 days and feeling very anxious. Patient states she did not take any ibuprofen but did take 1 tylenol 500 mg. Patient then stated she was having nausea and diarrhea. Nicky instructed patient she needed to get tested for COVID-19. Patient verbalized understanding.   Patient called back and spoke with estefania around tested positive for COVID. Patient was given all the information regarding COVID symptoms and quarantine. Patient verbalized understanding and informed of the free testing we will have on Wednesday. Dr. Amil Amen has been informed and agrees   Patient symptoms included headache, nausea,chills and fatigue

## 2019-02-23 ENCOUNTER — Other Ambulatory Visit: Payer: Self-pay

## 2019-03-04 ENCOUNTER — Ambulatory Visit: Payer: Self-pay | Admitting: Internal Medicine

## 2019-04-05 ENCOUNTER — Emergency Department (HOSPITAL_COMMUNITY)
Admission: EM | Admit: 2019-04-05 | Discharge: 2019-04-05 | Disposition: A | Discharge status: Home / Self Care | Payer: No Typology Code available for payment source | Attending: Emergency Medicine | Admitting: Emergency Medicine

## 2019-04-05 ENCOUNTER — Other Ambulatory Visit: Payer: Self-pay

## 2019-04-05 DIAGNOSIS — Y929 Unspecified place or not applicable: Secondary | ICD-10-CM | POA: Insufficient documentation

## 2019-04-05 DIAGNOSIS — S29012A Strain of muscle and tendon of back wall of thorax, initial encounter: Secondary | ICD-10-CM | POA: Insufficient documentation

## 2019-04-05 DIAGNOSIS — S0340XA Sprain of jaw, unspecified side, initial encounter: Secondary | ICD-10-CM

## 2019-04-05 DIAGNOSIS — Y939 Activity, unspecified: Secondary | ICD-10-CM | POA: Insufficient documentation

## 2019-04-05 DIAGNOSIS — S0342XA Sprain of jaw, left side, initial encounter: Secondary | ICD-10-CM | POA: Insufficient documentation

## 2019-04-05 DIAGNOSIS — X58XXXA Exposure to other specified factors, initial encounter: Secondary | ICD-10-CM | POA: Insufficient documentation

## 2019-04-05 DIAGNOSIS — H9202 Otalgia, left ear: Secondary | ICD-10-CM | POA: Insufficient documentation

## 2019-04-05 DIAGNOSIS — S46819A Strain of other muscles, fascia and tendons at shoulder and upper arm level, unspecified arm, initial encounter: Secondary | ICD-10-CM

## 2019-04-05 DIAGNOSIS — Y999 Unspecified external cause status: Secondary | ICD-10-CM | POA: Insufficient documentation

## 2019-04-05 MED ORDER — DIAZEPAM 5 MG PO TABS
5.0000 mg | ORAL_TABLET | Freq: Once | ORAL | Status: AC
  Administered 2019-04-05: 5 mg via ORAL
  Filled 2019-04-05: qty 1

## 2019-04-05 MED ORDER — KETOROLAC TROMETHAMINE 60 MG/2ML IM SOLN
15.0000 mg | Freq: Once | INTRAMUSCULAR | Status: AC
  Administered 2019-04-05: 10:00:00 15 mg via INTRAMUSCULAR
  Filled 2019-04-05: qty 2

## 2019-04-05 MED ORDER — ACETAMINOPHEN 500 MG PO TABS
1000.0000 mg | ORAL_TABLET | Freq: Once | ORAL | Status: AC
  Administered 2019-04-05: 10:00:00 1000 mg via ORAL
  Filled 2019-04-05: qty 2

## 2019-04-05 NOTE — ED Notes (Signed)
C/o oeft sided jaw pain and pain across both sides of her neck and posterior portion of neck.  No  Injury.

## 2019-04-05 NOTE — ED Provider Notes (Signed)
[facility] Provider Note   CSN: [id number] Arrival date & time: 04/05/19  0308     History   Chief Complaint Chief Complaint  Patient presents with  . Neck Pain  . Jaw Pain  . Back Pain    HPI Autumn Benton Patricio is a 38 y.o. female.     38 yo F with a chief complaints of bilateral neck pain.  This been going on for the past 4 months or so.  She has been taking Tylenol and ibuprofen with minimal relief.  States that it hurts on both sides of the neck worse when she turns her head.  Denies trauma denies fevers.  Feels it radiates down her arms.  She is also been complaining of left jaw pain.  States it hurts of the joint of the jaw and into her ear.  She was diagnosed with the novel coronavirus about a month ago but since then has had a negative test and is gone back to work.  The history is provided by the patient.  Neck Pain Associated symptoms: no chest pain, no fever and no headaches   Back Pain Associated symptoms: no chest pain, no dysuria, no fever and no headaches   Illness Severity:  Moderate Onset quality:  Gradual Duration:  4 months Timing:  Intermittent Progression:  Worsening Chronicity:  New Associated symptoms: ear pain   Associated symptoms: no chest pain, no congestion, no fever, no headaches, no myalgias, no nausea, no rhinorrhea, no shortness of breath, no vomiting and no wheezing     Past Medical History:  Diagnosis Date  . Arthritis 09/19/2016  . Chlamydia   . Dyslipidemia (high LDL; low HDL)   . Miscarriage   . Prediabetes     Patient Active Problem List   Diagnosis Date Noted  . Class 1 obesity without serious comorbidity with body mass index (BMI) of 30.0 to 30.9 in adult 11/18/2018  . Dyslipidemia (high LDL; low HDL)   . Arm mass, left 10/01/2018  . Prediabetes 10/30/2017  . Arthritis 09/19/2016    No past surgical history on file.   OB History    Gravida  7   Para  6   Term  6   Preterm      AB      Living  6     SAB      TAB      Ectopic      Multiple      Live Births  1            Home Medications    Prior to Admission medications   Medication Sig Start Date End Date Taking? Authorizing Provider  etonogestrel (NEXPLANON) 68 MG IMPL implant 1 each by Subdermal route once. Last placed 04/2017 to be removed 04/2020    [provider]  fluconazole (DIFLUCAN) 150 MG tablet 1 tab by mouth daily for 2 days following completion of Metronidazole 11/23/18   Julieanne MansonMulberry, Elizabeth, MD  ibuprofen (ADVIL,MOTRIN) 200 MG tablet 3 tabs by mouth twice daily with meals 08/01/17   Julieanne MansonMulberry, Elizabeth, MD  metroNIDAZOLE (FLAGYL) 500 MG tablet 1 tab by mouth twice daily for 7 days. 11/23/18   Julieanne MansonMulberry, Elizabeth, MD  Multiple Vitamin (MULTIVITAMIN) tablet Take 1 tablet by mouth daily.    [provider]    Family History Family History  Problem Relation Age of Onset  . Alcohol abuse Father        had kidney and  liver disease at one point related to ETOH intake, but patient states those resolved.    Social History Social History   Tobacco Use  . Smoking status: Never Smoker  . Smokeless tobacco: Never Used  Substance Use Topics  . Alcohol use: No  . Drug use: No     Allergies   Patient has no known allergies.   Review of Systems Review of Systems  Constitutional: Negative for chills and fever.  HENT: Positive for ear pain. Negative for congestion and rhinorrhea.   Eyes: Negative for redness and visual disturbance.  Respiratory: Negative for shortness of breath and wheezing.   Cardiovascular: Negative for chest pain and palpitations.  Gastrointestinal: Negative for nausea and vomiting.  Genitourinary: Negative for dysuria and urgency.  Musculoskeletal: Positive for back pain and neck pain. Negative for arthralgias and myalgias.  Skin: Negative for pallor and wound.  Neurological: Negative for dizziness and headaches.     Physical  Exam Updated Vital Signs BP 110/71   Pulse 87   Temp 99 F (37.2 C) (Oral)   Resp 18   SpO2 98%   Physical Exam Vitals signs and nursing note reviewed.  Constitutional:      General: She is not in acute distress.    Appearance: She is well-developed. She is not diaphoretic.  HENT:     Head: Normocephalic and atraumatic.     Comments: Swollen turbinates, posterior nasal drip, no noted sinus ttp, tm normal bilaterally.   Eyes:     Pupils: Pupils are equal, round, and reactive to light.  Neck:     Musculoskeletal: Normal range of motion and neck supple.  Cardiovascular:     Rate and Rhythm: Normal rate and regular rhythm.     Heart sounds: No murmur. No friction rub. No gallop.   Pulmonary:     Effort: Pulmonary effort is normal.     Breath sounds: No wheezing or rales.  Abdominal:     General: There is no distension.     Palpations: Abdomen is soft.     Tenderness: There is no abdominal tenderness.  Musculoskeletal:        General: Tenderness present.     Comments: Tender over the trapezius muscle bellies bilaterally.  Pain to the left TMJ joint.  Skin:    General: Skin is warm and dry.  Neurological:     Mental Status: She is alert and oriented to person, place, and time.  Psychiatric:        Behavior: Behavior normal.      ED Treatments / Results  Labs (all labs ordered are listed, but only abnormal results are displayed) Labs Reviewed - No data to display  EKG None  Radiology No results found.  Procedures Procedures (including critical care time)  Medications Ordered in ED Medications  ketorolac (TORADOL) injection 15 mg (has no administration in time range)  acetaminophen (TYLENOL) tablet 1,000 mg (has no administration in time range)  diazepam (VALIUM) tablet 5 mg (has no administration in time range)     Initial Impression / Assessment and Plan / ED Course  I have reviewed the triage vital signs and the nursing notes.  Pertinent labs & imaging  results that were available during my care of the patient were reviewed by me and considered in my medical decision making (see chart for details).        38 yo F with a chief complaint of neck pain and jaw pain.  This been going on for  about 4 months off and on.  Most consistent with a trapezius strain bilaterally.  I feel this is likely continuing because she continues to work.  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  No obvious source of infection.  PCP follow-up.  9:43 AM:  I have discussed the diagnosis/risks/treatment options with the patient and believe the pt to be eligible for discharge home to follow-up with PCP. We also discussed returning to the ED immediately if new or worsening sx occur. We discussed the sx which are most concerning (e.g., sudden worsening pain, fever, inability to tolerate by mouth) that necessitate immediate return. Medications administered to the patient during their visit and any new prescriptions provided to the patient are listed below.  Medications given during this visit Medications  ketorolac (TORADOL) injection 15 mg (has no administration in time range)  acetaminophen (TYLENOL) tablet 1,000 mg (has no administration in time range)  diazepam (VALIUM) tablet 5 mg (has no administration in time range)     The patient appears reasonably screen and/or stabilized for discharge and I doubt any other medical condition or other [facility] requiring further screening, evaluation, or treatment in the ED at this time prior to discharge.    Final Clinical Impressions(s) / ED Diagnoses   Final diagnoses:  Strain of trapezius muscle, unspecified laterality, initial encounter  TMJ (sprain of temporomandibular joint), initial encounter    ED Discharge Orders    None       Deno Etienne, DO 04/05/19 [phone]

## 2019-04-05 NOTE — Discharge Instructions (Signed)
Take 4 over the counter ibuprofen tablets 3 times a day or 2 over-the-counter naproxen tablets twice a day for pain. Also take tylenol 1000mg(2 extra strength) four times a day.    

## 2019-04-05 NOTE — ED Triage Notes (Signed)
Per pt she has been having neck and jaw pain for 2 days. Pt said she also has been having lower back pain. Pt took an aspirin but has not help with pain. Pt says having body aches. No fevers, no chills

## 2020-06-26 ENCOUNTER — Ambulatory Visit: Payer: Self-pay | Attending: Family Medicine | Admitting: Family Medicine

## 2020-06-26 ENCOUNTER — Encounter: Payer: Self-pay | Admitting: Family Medicine

## 2020-06-26 ENCOUNTER — Other Ambulatory Visit: Payer: Self-pay

## 2020-06-26 VITALS — BP 149/66 | HR 65 | Ht 64.0 in | Wt 177.0 lb

## 2020-06-26 DIAGNOSIS — G44209 Tension-type headache, unspecified, not intractable: Secondary | ICD-10-CM

## 2020-06-26 DIAGNOSIS — R030 Elevated blood-pressure reading, without diagnosis of hypertension: Secondary | ICD-10-CM

## 2020-06-26 DIAGNOSIS — F419 Anxiety disorder, unspecified: Secondary | ICD-10-CM

## 2020-06-26 MED ORDER — HYDROXYZINE HCL 25 MG PO TABS: 25.0000 mg | ORAL_TABLET | Freq: Three times a day (TID) | ORAL | 1 refills | Status: DC | PRN

## 2020-06-26 NOTE — Progress Notes (Signed)
Has been having headaches. Has had some anxiety.

## 2020-06-26 NOTE — Patient Instructions (Signed)
Control de la ansiedad en los adultos Managing Anxiety, Adult Despus de haber sido diagnosticado con trastorno de ansiedad, podra sentirse aliviado por comprender por qu se haba sentido o haba actuado de cierto modo. Es posible que tambin se sienta abrumado por el tratamiento que tiene por delante y por lo que este significar para su vida. Con atencin y ayuda, puede manejar esta afeccin y recuperarse. Cmo manejar los cambios en el estilo de vida Control del estrs y la ansiedad El estrs es la reaccin del cuerpo ante los cambios y los acontecimientos de la vida, tanto buenos como malos. La mayora de los episodios de estrs duran slo algunas horas, pero el estrs puede ser continuo y conducir a ms que solo estrs. Aunque el estrs puede desempear un papel importante en la ansiedad, no es lo mismo que la ansiedad. El estrs generalmente es causado por algo externo, como una fecha lmite, una prueba o una competencia. El estrs normalmente pasa despus de que el evento desencadenante ha terminado.  La ansiedad es causada por algo interno, por ejemplo, imaginar un resultado terrible o preocuparse porque algo ir mal y lo devastar. A menudo, la ansiedad no desaparece incluso despus de que el evento desencadenante ha finalizado y puede tornarse en una preocupacin a largo plazo (crnica). Es importante comprender las diferencias entre el estrs y la ansiedad, y controlar el estrs de manera efectiva para que no genere una respuesta de ansiedad. Hable con el mdico o un consejero para obtener ms informacin sobre cmo reducir la ansiedad y el estrs. Es posible que el profesional sugiera tcnicas para reducir la tensin, tales como:  Musicoterapia. Esto podra incluir crear o escuchar msica que disfrute y lo inspire.  Meditacin consciente. Esto implica prestar atencin a la respiracin normal sin intentar controlarla. Puede realizarse mientras est sentado o camina.  Oracin centrante. Esto  implica centrarse en una palabra, frase o imagen sagrada que le signifique algo y le genere paz.  Respiracin profunda. Para hacer esto, expanda el estmago e inhale lentamente por la nariz. Mantenga el aire durante unos 3a5segundos. Luego, exhale lentamente mientras deja que los msculos del estmago se relajen.  Dilogo interno. Esto implica identificar patrones de pensamiento que provocan reacciones de ansiedad y cambiar esos patrones.  Relajacin muscular. Esto implica tensar los msculos y, luego, relajarlos. Elija una tcnica para reducir la tensin que se adapte a su estilo de vida y su personalidad. Estas tcnicas llevan tiempo y prctica. Resrvese de 5a15minutos por da para hacerlas. Algunos terapeutas pueden ofrecer orientacin y capacitacin en estas tcnicas. Es posible que algunos planes de seguros mdicos cubran la capacitacin. Otras cosas que puede hacer para controlar el estrs y la ansiedad incluyen:  Llevar un diario de estrs/ansiedad. Esto puede ayudarlo a identificar qu le desencadena su reaccin y, luego, aprender las maneras de controlar su respuesta al respecto.  Pensar en cmo reacciona ante ciertas situaciones. Es posible que no sea capaz de controlar todo, pero puede controlar su respuesta.  Hacerse tiempo para las actividades que lo ayudan a relajarse y no sentir culpa por pasar su tiempo de este modo.  La formacin de imgenes visuales y el yoga pueden ayudarlo a mantener la calma y relajarse.   Medicamentos Los medicamentos pueden ayudar a aliviar los sntomas. Algunos medicamentos para la ansiedad:  Medicamentos contra la ansiedad.  Antidepresivos. A menudo, los medicamentos se usan como tratamiento primario para el trastorno de ansiedad. Un mdico recetar los medicamentos. Cuando se usan juntos, los medicamentos,   la psicoterapia y las tcnicas de reduccin de la tensin pueden ser el tratamiento ms efectivo. Las [employer] relaciones  interpersonales pueden ser muy importantes para ayudar a su recuperacin. Intente pasar ms tiempo interactuando con amigos y familiares de [place]. Considere la posibilidad de ir a terapia de pareja, tomar clases de educacin familiar o ir a Information systems manager. La terapia puede ayudarlos a usted y a los dems a comprender mejor su afeccin. Cmo Facilities manager en su ansiedad Cada persona responde de [place] diferente al tratamiento de la ansiedad. Se dice que est recuperado de la ansiedad cuando los sntomas disminuyen y dejan de Producer, television/film/video en las actividades diarias en el hogar o [place]. Esto podra significar que usted comenzar a Radio producer lo siguiente:  Dealer y atencin. Tener menos interferencia de la preocupacin en el pensamiento diario.  Dormir mejor.  Estar menos irritable.  Tener ms energa.  Tener [employer]. Es Public librarian cundo el trastorno [place]. Comunquese con el mdico si sus sntomas interfieren en su hogar o su trabajo, y usted siente que su afeccin no est mejorando. Siga estas instrucciones en su casa: Actividad  Realizar actividad fsica. La [employer] de los adultos debe hacer lo siguiente: ? Education officer, environmental, al [place], de actividad fsica por semana. El ejercicio debe aumentar la frecuencia cardaca y Media planner transpirar (ejercicio de intensidad moderada). ? Realizar ejercicios de fortalecimiento por lo [employer] por semana.  Dormir bien y por el tiempo adecuado. La [employer] de los adultos necesitan entre 7y9horas de sueo todas las noches. Estilo de vida  Siga una dieta saludable que incluya abundantes frutas, verduras, cereales integrales, productos lcteos descremados y protenas magras. No consuma muchos alimentos ricos en grasas slidas, azcares agregados o sal.  Opte por cosas que le simplifiquen la vida.  No consuma ningn producto que contenga nicotina o tabaco, como cigarrillos, cigarrillos electrnicos y  tabaco de Theatre manager. Si necesita ayuda para dejar de fumar, consulte al mdico.  Evite el consumo de cafena, alcohol y ciertos medicamentos contra el resfro de venta sin receta. Estos podran Optician, dispensing. Pregntele al farmacutico qu medicamentos no debera tomar.   Instrucciones generales  [employer] de venta libre y los recetados solamente como se lo haya indicado el mdico.  Oceanographer a todas las visitas de seguimiento como se lo haya indicado el mdico. Esto es importante. Dnde buscar apoyo Puede conseguir ayuda y [employer] siguientes lugares:  Grupos de [place].  Organizaciones comunitarias y en lnea.  Un lder espiritual de confianza.  Terapia de pareja.  Clases de educacin familiar.  Terapia familiar. Dnde buscar ms informacin Formar parte de un grupo de apoyo podra resultarle til para enfrentar la ansiedad. Las siguientes fuentes pueden ayudarlo a Medical laboratory scientific officer consejeros o grupos de apoyo cerca de su hogar:  Mental Health America (Salud Mental de los Estados Unidos): www.mentalhealthamerica.net  Anxiety and Depression Association of [place] [ADAA] (Asociacin de Ansiedad y Depresin de los Estados Unidos): ProgramCam.de  The First American on Mental Illness [NAMI] (Alianza Nacional Sobre Enfermedades Mentales): www.nami.org Comunquese con un mdico si:  Le resulta difcil permanecer concentrado o finalizar las tareas diarias.  Pasa muchas horas por da sintindose preocupado por la vida cotidiana.  La preocupacin le provoca un cansancio extremo.  Comienza a tener dolores de [place] o nuseas, o a sentirse tenso.  Orina ms de lo normal.  Tiene diarrea. Solicite ayuda inmediatamente si tiene:  Latidos cardacos acelerados y falta de aire.  Pensamientos acerca de lastimarse  o lastimar a Economist. Si alguna vez siente que puede lastimarse o Physicist, medical a Economist, o tiene pensamientos de poner fin a su vida, busque ayuda de  inmediato. Puede dirigirse al servicio de emergencias ms cercano o comunicarse con:  El servicio de emergencias de su localidad (911 en [place].).  Una lnea de asistencia al suicida y Visual merchandiser en crisis, como National Suicide Prevention Lifeline (Lnea Nacional de Prevencin del Suicidio), al (910)073-2455. Est disponible las 24 horas del da. Resumen  Tomar medidas para aprender y usar tcnicas de reduccin de la tensin puede ayudarlo a calmarse y a Chiropractor reaccin de ansiedad.  Cuando se usan juntos, los medicamentos, la psicoterapia y las tcnicas de reduccin de la tensin pueden ser el tratamiento ms efectivo.  Los familiares, los amigos y las parejas pueden tener un lugar importante en su recuperacin del trastorno de ansiedad. Esta informacin no tiene Theme park manager el consejo del mdico. Asegrese de hacerle al mdico cualquier pregunta que tenga. Document Revised: 11/18/2018 Document Reviewed: 11/18/2018 Elsevier Patient Education  2021 [employer].

## 2020-06-26 NOTE — Progress Notes (Signed)
Subjective:  Patient ID: Autumn Benton, female    DOB: 04/21/1981  Age: 40 y.o. MRN: [id number]  CC: New Patient (Initial Visit)   HPI Aima Mcwhirt is a 40 year old female with a history of previous COVID-19 infection. Ever since she had COVID-19 she has had problems with anxiety. She describes this as 'feeling in distress, blood pressure getting elevated'. She has not been checking her BP at home but states her BP at the ED was elevated. Of note in 04/05/2019 BP was 110/71 at the ED.  Today it is 149/66. She has also been worried as her family in [place] had a severe case of COVID-19 infection.  Has also had headaches and has insomnia. Complains of being under a lot of stress.  Denies presence of sinus symptoms. Past Medical History:  Diagnosis Date  . Arthritis 09/19/2016  . Chlamydia   . Dyslipidemia (high LDL; low HDL)   . Miscarriage   . Prediabetes     No past surgical history on file.  Family History  Problem Relation Age of Onset  . Alcohol abuse Father        had kidney and liver disease at one point related to ETOH intake, but patient states those resolved.    No Known Allergies  Outpatient Medications Prior to Visit  Medication Sig Dispense Refill  . etonogestrel (NEXPLANON) 68 MG IMPL implant 1 each by Subdermal route once. Last placed 04/2017 to be removed 04/2020    . hydrOXYzine (ATARAX/VISTARIL) 50 MG tablet Take 50 mg by mouth 2 (two) times daily as needed.    . fluconazole (DIFLUCAN) 150 MG tablet 1 tab by mouth daily for 2 days following completion of Metronidazole (Patient not taking: Reported on 06/26/2020) 2 tablet 0  . ibuprofen (ADVIL,MOTRIN) 200 MG tablet 3 tabs by mouth twice daily with meals (Patient not taking: Reported on 06/26/2020) 30 tablet 0  . metroNIDAZOLE (FLAGYL) 500 MG tablet 1 tab by mouth twice daily for 7 days. (Patient not taking: Reported on 06/26/2020) 14 tablet 0  . Multiple Vitamin (MULTIVITAMIN) tablet Take 1 tablet  by mouth daily. (Patient not taking: Reported on 06/26/2020)     No facility-administered medications prior to visit.     ROS Review of Systems  Constitutional: Negative for activity change, appetite change and fatigue.  HENT: Negative for congestion, sinus pressure and sore throat.   Eyes: Negative for visual disturbance.  Respiratory: Negative for cough, chest tightness, shortness of breath and wheezing.   Cardiovascular: Negative for chest pain and palpitations.  Gastrointestinal: Negative for abdominal distention, abdominal pain and constipation.  Endocrine: Negative for polydipsia.  Genitourinary: Negative for dysuria and frequency.  Musculoskeletal: Negative for arthralgias and back pain.  Skin: Negative for rash.  Neurological: Positive for headaches. Negative for tremors, light-headedness and numbness.  Hematological: Does not bruise/bleed easily.  Psychiatric/Behavioral: Negative for agitation and behavioral problems.       Positive for anxiety    Objective:  BP (!) 149/66   Pulse 65   Ht 5\' 4"  (1.626 m)   Wt 177 lb (80.3 kg)   SpO2 100%   BMI 30.38 kg/m   BP/Weight 06/26/2020 04/05/2019 10/22/2018  Systolic BP 149 110 126  Diastolic BP 66 71 84  Wt. (Lbs) 177 - 176  BMI 30.38 - 31.68      Physical Exam Constitutional:      Appearance: She is well-developed.  Neck:     Vascular: No JVD.  Cardiovascular:  Rate and Rhythm: Normal rate.     Heart sounds: Normal heart sounds. No murmur heard.   Pulmonary:     Effort: Pulmonary effort is normal.     Breath sounds: Normal breath sounds. No wheezing or rales.  Chest:     Chest wall: No tenderness.  Abdominal:     General: Bowel sounds are normal. There is no distension.     Palpations: Abdomen is soft. There is no mass.     Tenderness: There is no abdominal tenderness.  Musculoskeletal:        General: Normal range of motion.     Right lower leg: No edema.     Left lower leg: No edema.  Neurological:      Mental Status: She is alert and oriented to person, place, and time.  Psychiatric:        Mood and Affect: Mood normal.     CMP Latest Ref Rng & Units 10/01/2018 08/01/2017 01/03/2017  Glucose 65 - 99 mg/dL 89 759(F) 638(G)  BUN 6 - 24 mg/dL 11 7 11   Creatinine 0.57 - 1.00 mg/dL 6.65 9.93  Sodium 134 - 144 mmol/L 141 143 138  Potassium 3.5 - 5.2 mmol/L 3.8 4.0 3.5  Chloride 96 - 106 mmol/L 106 106 108  CO2 20 - 29 mmol/L 21 21 21(L)  Calcium 8.7 - 10.2 mg/dL 9.3 9.2 9.3  Total Protein 6.0 - 8.5 g/dL 7.2 - -  Total Bilirubin 0.0 - 1.2 mg/dL 1.0 - -  Alkaline Phos 39 - 117 IU/L 62 - -  AST 0 - 40 IU/L 16 - -  ALT 0 - 32 IU/L 22 - -    Lipid Panel     Component Value Date/Time   CHOL 188 06/25/2018 0936   TRIG 85 06/25/2018 0936   HDL 40 06/25/2018 0936   LDLCALC 131 (H) 06/25/2018 0936    CBC    Component Value Date/Time   WBC 6.2 10/01/2018 1017   WBC 6.9 01/03/2017 0139   RBC 4.57 10/01/2018 1017   RBC 4.41 01/03/2017 0139   HGB 14.0 10/01/2018 1017   HCT 40.6 10/01/2018 1017   PLT 206 10/01/2018 1017   MCV 89 10/01/2018 1017   MCH 30.6 10/01/2018 1017   MCH 30.4 01/03/2017 0139   MCHC 34.5 10/01/2018 1017   MCHC 34.3 01/03/2017 0139   RDW 12.2 10/01/2018 1017   LYMPHSABS 1.8 10/01/2018 1017   MONOABS 402 12/01/2015 1040   EOSABS 0.2 10/01/2018 1017   BASOSABS 0.1 10/01/2018 1017    Lab Results  Component Value Date   HGBA1C 5.7 (H) 06/25/2018    Assessment & Plan:  1. Anxiety Uncontrolled Multifactorial including stress, history of Covid, family in 06/27/2018 This could be contributing to her anxiety Trial of hydroxyzine - hydrOXYzine (ATARAX/VISTARIL) 25 MG tablet; Take 1 tablet (25 mg total) by mouth every 8 (eight) hours as needed.  Dispense: 60 tablet; Refill: 1  2. Tension headache See #1 above She can use OTC analgesics  3. Elevated blood-pressure reading without diagnosis of hypertension Advised to work on lifestyle modification We will  check blood pressure at next visit and adjust regimen accordingly Counseled on blood pressure goal of less than 130/80, low-sodium, DASH diet, medication compliance, 150 minutes of moderate intensity exercise per week. Discussed medication compliance, adverse effects.    Meds ordered this encounter  Medications  . hydrOXYzine (ATARAX/VISTARIL) 25 MG tablet    Sig: Take 1 tablet (25 mg total) by  mouth every 8 (eight) hours as needed.    Dispense:  60 tablet    Refill:  1    Follow-up: Return in about 3 months (around 09/23/2020) for Follow-up on anxiety.       Hoy Register, MD, FAAFP. [facility] [place], [place] [phone]   06/26/2020, 9:04 AM

## 2020-09-26 ENCOUNTER — Encounter: Payer: Self-pay | Admitting: Family Medicine

## 2020-09-26 ENCOUNTER — Other Ambulatory Visit: Payer: Self-pay

## 2020-09-26 ENCOUNTER — Ambulatory Visit: Payer: Self-pay | Attending: Family Medicine | Admitting: Family Medicine

## 2020-09-26 VITALS — BP 105/63 | HR 74 | Ht 64.0 in | Wt 176.4 lb

## 2020-09-26 DIAGNOSIS — Z1231 Encounter for screening mammogram for malignant neoplasm of breast: Secondary | ICD-10-CM

## 2020-09-26 DIAGNOSIS — Z13228 Encounter for screening for other metabolic disorders: Secondary | ICD-10-CM

## 2020-09-26 DIAGNOSIS — F419 Anxiety disorder, unspecified: Secondary | ICD-10-CM

## 2020-09-26 DIAGNOSIS — R7303 Prediabetes: Secondary | ICD-10-CM

## 2020-09-26 LAB — POCT GLYCOSYLATED HEMOGLOBIN (HGB A1C): Hemoglobin A1C: 5.8 % — AB (ref 4.0–5.6)

## 2020-09-26 MED ORDER — ONE-DAILY MULTI VITAMINS PO TABS: 1.0000 | ORAL_TABLET | Freq: Every day | ORAL | 3 refills | Status: AC

## 2020-09-26 NOTE — Progress Notes (Signed)
Subjective:  Patient ID: Autumn Benton, female    DOB: [birth year]  Age: 40 y.o. MRN: [id number]  CC: Anxiety   HPI Autumn Benton is a 40 year old female with a history of anxiety who presents today for follow-up visit. At her last visit she had elevated blood pressure but today her blood pressure is normal. Today, she is worried that she might have an elevated cholesterol or Diabetes and would like to be checked for it. Prescribed Hydroxyzine previously but she no longer needs them. Would also like to be prescribed vitamins Past Medical History:  Diagnosis Date  . Arthritis 09/19/2016  . Chlamydia   . Dyslipidemia (high LDL; low HDL)   . Miscarriage   . Prediabetes     History reviewed. No pertinent surgical history.  Family History  Problem Relation Age of Onset  . Alcohol abuse Father        had kidney and liver disease at one point related to ETOH intake, but patient states those resolved.    No Known Allergies  Outpatient Medications Prior to Visit  Medication Sig Dispense Refill  . etonogestrel (NEXPLANON) 68 MG IMPL implant 1 each by Subdermal route once. Last placed 04/2017 to be removed 04/2020    . fluconazole (DIFLUCAN) 150 MG tablet 1 tab by mouth daily for 2 days following completion of Metronidazole (Patient not taking: No sig reported) 2 tablet 0  . hydrOXYzine (ATARAX/VISTARIL) 25 MG tablet Take 1 tablet (25 mg total) by mouth every 8 (eight) hours as needed. (Patient not taking: Reported on 09/26/2020) 60 tablet 1  . ibuprofen (ADVIL,MOTRIN) 200 MG tablet 3 tabs by mouth twice daily with meals (Patient not taking: No sig reported) 30 tablet 0  . metroNIDAZOLE (FLAGYL) 500 MG tablet 1 tab by mouth twice daily for 7 days. (Patient not taking: No sig reported) 14 tablet 0  . Multiple Vitamin (MULTIVITAMIN) tablet Take 1 tablet by mouth daily. (Patient not taking: No sig reported)     No facility-administered medications prior to visit.      ROS Review of Systems  Constitutional: Negative for activity change, appetite change and fatigue.  HENT: Negative for congestion, sinus pressure and sore throat.   Eyes: Negative for visual disturbance.  Respiratory: Negative for cough, chest tightness, shortness of breath and wheezing.   Cardiovascular: Negative for chest pain and palpitations.  Gastrointestinal: Negative for abdominal distention, abdominal pain and constipation.  Endocrine: Negative for polydipsia.  Genitourinary: Negative for dysuria and frequency.  Musculoskeletal: Negative for arthralgias and back pain.  Skin: Negative for rash.  Neurological: Negative for tremors, light-headedness and numbness.  Hematological: Does not bruise/bleed easily.  Psychiatric/Behavioral: Negative for agitation and behavioral problems.    Objective:  BP 105/63   Pulse 74   Ht 5' 4"  (1.626 m)   Wt 176 lb 6.4 oz (80 kg)   SpO2 99%   BMI 30.28 kg/m   BP/Weight 09/26/2020 06/26/2020 74/01/4495  Systolic BP 759 163 846  Diastolic BP 63 66 71  Wt. (Lbs) 176.4 177 -  BMI 30.28 30.38 -      Physical Exam Constitutional:      Appearance: She is well-developed.  Neck:     Vascular: No JVD.  Cardiovascular:     Rate and Rhythm: Normal rate.     Heart sounds: Normal heart sounds. No murmur heard.   Pulmonary:     Effort: Pulmonary effort is normal.     Breath sounds: Normal breath sounds. No wheezing  or rales.  Chest:     Chest wall: No tenderness.  Abdominal:     General: Bowel sounds are normal. There is no distension.     Palpations: Abdomen is soft. There is no mass.     Tenderness: There is no abdominal tenderness.  Musculoskeletal:        General: Normal range of motion.     Right lower leg: No edema.     Left lower leg: No edema.  Neurological:     Mental Status: She is alert and oriented to person, place, and time.  Psychiatric:        Mood and Affect: Mood normal.     CMP Latest Ref Rng & Units 10/01/2018  08/01/2017 01/03/2017  Glucose 65 - 99 mg/dL 89 104(H) 146(H)  BUN 6 - 24 mg/dL 11 7 11   Creatinine 0.57 - 1.00 mg/dL 0.70 0.71 0.71  Sodium 134 - 144 mmol/L 141 143 138  Potassium 3.5 - 5.2 mmol/L 3.8 4.0 3.5  Chloride 96 - 106 mmol/L 106 106 108  CO2 20 - 29 mmol/L 21 21 21(L)  Calcium 8.7 - 10.2 mg/dL 9.3 9.2 9.3  Total Protein 6.0 - 8.5 g/dL 7.2 - -  Total Bilirubin 0.0 - 1.2 mg/dL 1.0 - -  Alkaline Phos 39 - 117 IU/L 62 - -  AST 0 - 40 IU/L 16 - -  ALT 0 - 32 IU/L 22 - -    Lipid Panel     Component Value Date/Time   CHOL 188 06/25/2018 0936   TRIG 85 06/25/2018 0936   HDL 40 06/25/2018 0936   LDLCALC 131 (H) 06/25/2018 0936    CBC    Component Value Date/Time   WBC 6.2 10/01/2018 1017   WBC 6.9 01/03/2017 0139   RBC 4.57 10/01/2018 1017   RBC 4.41 01/03/2017 0139   HGB 14.0 10/01/2018 1017   HCT 40.6 10/01/2018 1017   PLT 206 10/01/2018 1017   MCV 89 10/01/2018 1017   MCH 30.6 10/01/2018 1017   MCH 30.4 01/03/2017 0139   MCHC 34.5 10/01/2018 1017   MCHC 34.3 01/03/2017 0139   RDW 12.2 10/01/2018 1017   LYMPHSABS 1.8 10/01/2018 1017   MONOABS 402 12/01/2015 1040   EOSABS 0.2 10/01/2018 1017   BASOSABS 0.1 10/01/2018 1017    Lab Results  Component Value Date   HGBA1C 5.8 (A) 09/26/2020    Assessment & Plan:  1. Prediabetes Counseled on lifestyle modification to prevent progression to type 2 diabetes mellitus Begin regular exercise regimen by means of walking with a goal of increasing to 150 minutes of moderate intensity exercise - POCT glycosylated hemoglobin (Hb A1C) - Lipid panel; Future - CMP14+EGFR; Future  2. Anxiety Controlled No longer needs hydroxyzine  3. Screening for metabolic disorder Labs have been ordered - MM 3D SCREEN BREAST BILATERAL; Future  4. Encounter for screening mammogram for malignant neoplasm of breast - MM 3D SCREEN BREAST BILATERAL; Future   Meds ordered this encounter  Medications  . Multiple Vitamin  (MULTIVITAMIN) tablet    Sig: Take 1 tablet by mouth daily.    Dispense:  30 tablet    Refill:  3    Follow-up: Return in about 6 months (around 03/29/2021) for Chronic disease management.       Charlott Rakes, MD, FAAFP. [facility] [place], [place]   09/26/2020, 9:03 AM

## 2020-09-26 NOTE — Patient Instructions (Signed)
[email or web address]">  Trastorno de ansiedad generalizada, en adultos Generalized Anxiety Disorder, Adult El trastorno de ansiedad generalizada (TAG) es una afeccin de salud mental. A diferencia de las preocupaciones normales, la ansiedad relacionada con el TAG no se desencadena por un acontecimiento especfico. Estas preocupaciones no desaparecen ni mejoran con el tiempo. EL TAG interfiere con las relaciones, el trabajo y los estudios. Los sntomas del TAG pueden variar de leves a graves. Las personas con TAG grave pueden tener intensas oleadas de ansiedad con sntomas fsicos que son similares a las crisis de angustia. Cules son las causas? Se desconoce la causa exacta del TAG, pero se cree que influyen los siguientes factores:  Diferencias en las sustancias qumicas naturales del cerebro.  Genes que se transmiten de padres a hijos.  Diferencias en la forma en que se perciben las amenazas.  El desarrollo durante la infancia.  La personalidad. Qu incrementa el riesgo? Los siguientes factores pueden hacer que sea ms propenso a contraer esta afeccin:  Ser mujer.  Tener antecedentes familiares de trastornos de ansiedad.  Ser muy tmido.  Experimentar acontecimientos muy estresantes en la vida, como la muerte de un ser querido.  Tener un entorno familiar muy estresante. Cules son los signos o sntomas? Con frecuencia, las personas que padecen el TAG se preocupan excesivamente por muchas cosas en la vida, tales como su salud y su familia. Otros sntomas son:  Sntomas mentales y emocionales: ? Preocupacin excesiva acerca de los desastres naturales. ? Miedo a llegar tarde. ? Dificultad para concentrarse. ? Temor de que otras personas juzguen su desempeo.  Sntomas fsicos: ? Fatiga. ? Dolores de cabeza, tensin muscular, contracciones musculares, temblores o sensacin de tambalearse. ? Sensacin de que el corazn late muy rpido. ? Sentir falta de aire o como que no se  puede respirar profundamente. ? Problemas para conciliar el sueo o para seguir durmiendo, o sensacin de agitacin. ? Sudoracin. ? Nuseas, diarrea o sndrome del intestino irritable (SII).  Sntomas de la conducta: ? Tener estados de nimo cambiantes o irritabilidad. ? Evitar situaciones nuevas. ? Evitar a las personas. ? Dificultad extrema para tomar decisiones. Cmo se diagnostica? Esta afeccin se diagnostica en funcin de los sntomas y los antecedentes mdicos. Adems, se le realizar un examen fsico. El mdico puede hacerle algunas pruebas para descartar que sus sntomas tengan otras causas. Para recibir un diagnstico del TAG, una persona debe tener ansiedad que:  Est fuera de su control.  Afecte distintos aspectos de su vida, como el trabajo y las relaciones.  Cause angustia que le impida participar en sus actividades habituales.  Incluya al menos tres sntomas del TAG, tales como desasosiego, fatiga, dificultad para concentrarse, irritabilidad, tensin muscular o problemas para dormir. Antes de que su mdico pueda confirmar el diagnstico de TAG, estos sntomas deben estar presentes ms das de los que no lo estn y deben tener una duracin de seis meses o ms. Cmo se trata? El tratamiento de esta afeccin puede incluir:  Medicamentos. Por lo general, los medicamentos antidepresivos se recetan para un control diario a largo plazo. Se pueden agregar medicamentos para la ansiedad en casos graves, especialmente cuando ocurren crisis de angustia.  Psicoterapia (psicoanlisis). Determinados tipos de psicoterapia pueden ser tiles para tratar el TAG al brindar apoyo, educacin y orientacin. Entre las opciones se incluyen las siguientes: ? Terapia cognitivo conductual (TCC). Las personas aprenden habilidades para afrontar situaciones y tcnicas para poder calmarse para aliviar sus sntomas fsicos. Aprenden a identificar comportamientos y pensamientos no realistas y   a  reemplazarlos por comportamientos y pensamientos ms adecuados. ? Terapia de aceptacin y compromiso (acceptance and commitment therapy, ACT). Este tratamiento ensea a las personas a ser conscientes como una forma de lidiar con pensamientos y sentimientos no deseados. ? Biorretroalimentacin. Este proceso lo capacita para controlar la respuesta del cuerpo (respuesta psicolgica) a travs de tcnicas de respiracin y mtodos de relajacin. Usted trabajar con un terapeuta mientras se usan mquinas para controlar sus sntomas fsicos.  Tcnicas para controlar el estrs. Estas incluyen yoga, meditacin y ejercicio. Un especialista en salud mental puede ayudar a determinar qu tratamiento es el mejor para usted. Algunas personas pueden mejorar con solo un tipo de terapia. Sin embargo, otras personas requieren una combinacin de terapias.   Siga estas instrucciones en su casa: Estilo de vida  Mantenga horarios y una rutina uniforme.  Prevea las situaciones estresantes. Elabore un plan y reserve tiempo extra para trabajar con su plan.  Practique tcnicas de control del estrs o tcnicas para calmarse que su terapeuta o su mdico le haya enseado. Instrucciones generales  Tome los medicamentos de venta libre y los recetados solamente como se lo haya indicado el mdico.  Comprenda que es probable que tenga retrocesos. Acepte esto y sea amable con usted mismo mientras contina cuidndose mejor.  Reconozca y acepte sus logros, aunque le parezcan pequeos.  Concurra a todas las visitas de seguimiento como se lo haya indicado el mdico. Esto es importante. Comunquese con un mdico si:  Los sntomas no mejoran.  Sus sntomas empeoran.  Tiene signos de depresin tales como: ? Un estado de nimo constantemente triste o irritable. ? Ya no disfruta de actividades que le solan causar placer. ? Cambios en el peso o en sus hbitos de alimentacin. ? Cambios en los hbitos de sueo. ? Evita a amigos  y familiares. ? No tiene energa para realizar las tareas habituales. ? Tiene sentimientos de culpa o de inutilidad. Solicite ayuda de inmediato si:  Tiene pensamientos serios acerca de lastimarse a usted mismo o a otras personas. Si alguna vez siente que puede lastimarse o lastimar a otras personas, o tiene pensamientos de poner fin a su vida, busque ayuda de inmediato. Dirjase al servicio de urgencias ms cercano o:  Comunquese con el servicio de emergencias de su localidad (911 en los [place]).  Llame a una lnea de asistencia al suicida y atencin en crisis como National Suicide Prevention Lifeline (Lnea Nacional de Prevencin del Suicidio) al 1-800-273-8255. Est disponible las 24 horas del da en los [place]  Enve un mensaje de texto a la lnea para casos de crisis al [id number] (en los [place] Resumen  El trastorno de ansiedad generalizada (TAG) es una afeccin de salud mental que implica preocupacin no desencadenada por un acontecimiento especfico.  Con frecuencia, las personas que padecen el TAG se preocupan excesivamente por muchas cosas en la vida, tales como su salud y su familia.  El TAG puede causar sntomas tales como agitacin, dificultad para concentrarse, problemas para dormir, sudoracin frecuente, nuseas, diarrea, dolores de cabeza y temblores o contracciones musculares.  Un especialista en salud mental puede ayudar a determinar qu tratamiento es el mejor para usted. Algunas personas pueden mejorar con solo un tipo de terapia. Sin embargo, otras personas requieren una combinacin de terapias. Esta informacin no tiene como fin reemplazar el consejo del mdico. Asegrese de hacerle al mdico cualquier pregunta que tenga. Document Revised: 05/25/2019 Document Reviewed: 05/25/2019 Elsevier Patient Education  2021 Elsevier Inc.  

## 2020-09-27 ENCOUNTER — Ambulatory Visit: Payer: Self-pay | Attending: Family Medicine

## 2020-09-27 ENCOUNTER — Other Ambulatory Visit: Payer: Self-pay

## 2020-09-27 DIAGNOSIS — R7303 Prediabetes: Secondary | ICD-10-CM

## 2020-09-28 LAB — LIPID PANEL
Chol/HDL Ratio: 4.8 ratio — ABNORMAL HIGH (ref 0.0–4.4)
Cholesterol, Total: 211 mg/dL — ABNORMAL HIGH (ref 100–199)
HDL: 44 mg/dL (ref >39)
LDL Chol Calc (NIH): 154 mg/dL — ABNORMAL HIGH (ref 0–99)
Triglycerides: 75 mg/dL (ref 0–149)
VLDL Cholesterol Cal: 13 mg/dL (ref 5–40)

## 2020-09-28 LAB — CMP14+EGFR
ALT: 22 IU/L (ref 0–32)
AST: 15 IU/L (ref 0–40)
Albumin/Globulin Ratio: 1.8 (ref 1.2–2.2)
Albumin: 4.6 g/dL (ref 3.8–4.8)
Alkaline Phosphatase: 71 IU/L (ref 44–121)
BUN/Creatinine Ratio: 13 (ref 9–23)
BUN: 10 mg/dL (ref 6–24)
Bilirubin Total: 0.8 mg/dL (ref 0.0–1.2)
CO2: 21 mmol/L (ref 20–29)
Calcium: 9.1 mg/dL (ref 8.7–10.2)
Chloride: 107 mmol/L — ABNORMAL HIGH (ref 96–106)
Creatinine, Ser: 0.77 mg/dL (ref 0.57–1.00)
Globulin, Total: 2.5 g/dL (ref 1.5–4.5)
Glucose: 94 mg/dL (ref 65–99)
Potassium: 4 mmol/L (ref 3.5–5.2)
Sodium: 142 mmol/L (ref 134–144)
Total Protein: 7.1 g/dL (ref 6.0–8.5)
eGFR: 99 mL/min/{1.73_m2} (ref >59)

## 2020-09-29 ENCOUNTER — Ambulatory Visit: Payer: Self-pay | Attending: Family Medicine

## 2020-09-29 ENCOUNTER — Other Ambulatory Visit: Payer: Self-pay

## 2020-10-17 ENCOUNTER — Telehealth: Payer: Self-pay | Admitting: Family Medicine

## 2020-10-17 NOTE — Telephone Encounter (Signed)
Pt was sent a letter from financial dept. Inform them, that the application they submitted was incomplete, since they were missing some documentation at the time of the appointment, Pt need to reschedule and resubmit all new papers and application for [employer]FA and [employer], P.S. old documents has been sent back by mail to the Pt and Pt. need to make a new appt. 

## 2020-10-18 ENCOUNTER — Telehealth: Payer: Self-pay

## 2020-10-18 NOTE — Telephone Encounter (Signed)
-----   Message from Hoy Register, MD sent at 09/29/2020 10:29 AM EDT ----- Please inform her that she does have an elevated cholesterol but due to her low cardiovascular risk and medication is not indicated at this time.  I will encourage her to work on reducing intake of high cholesterol foods, desserts and sweets, exercising to lose weight.

## 2020-10-18 NOTE — Telephone Encounter (Signed)
Patient name and DOB has been verified Patient was informed of lab results. Patient had no questions.  

## 2020-10-31 ENCOUNTER — Telehealth: Payer: Self-pay | Admitting: Family Medicine

## 2020-10-31 ENCOUNTER — Ambulatory Visit: Payer: Self-pay

## 2020-10-31 ENCOUNTER — Ambulatory Visit: Payer: Self-pay | Attending: Family Medicine

## 2020-10-31 ENCOUNTER — Other Ambulatory Visit: Payer: Self-pay

## 2020-10-31 NOTE — Telephone Encounter (Signed)
Patient is requesting a referral to the dentist.

## 2020-11-01 NOTE — Telephone Encounter (Signed)
Pt will be mailed dental resources.

## 2020-11-17 ENCOUNTER — Other Ambulatory Visit: Payer: Self-pay

## 2020-11-17 DIAGNOSIS — N644 Mastodynia: Secondary | ICD-10-CM

## 2020-12-07 ENCOUNTER — Other Ambulatory Visit: Payer: Self-pay

## 2020-12-07 ENCOUNTER — Ambulatory Visit
Admission: RE | Admit: 2020-12-07 | Discharge: 2020-12-07 | Disposition: A | Discharge status: Home / Self Care | Payer: Self-pay | Source: Ambulatory Visit | Attending: Obstetrics and Gynecology | Admitting: Obstetrics and Gynecology

## 2020-12-07 ENCOUNTER — Encounter (INDEPENDENT_AMBULATORY_CARE_PROVIDER_SITE_OTHER): Payer: Self-pay

## 2020-12-07 ENCOUNTER — Other Ambulatory Visit: Payer: Self-pay | Admitting: Obstetrics and Gynecology

## 2020-12-07 ENCOUNTER — Ambulatory Visit: Payer: Self-pay | Admitting: *Deleted

## 2020-12-07 VITALS — Wt 174.1 lb

## 2020-12-07 DIAGNOSIS — N644 Mastodynia: Secondary | ICD-10-CM

## 2020-12-07 DIAGNOSIS — Z1239 Encounter for other screening for malignant neoplasm of breast: Secondary | ICD-10-CM

## 2020-12-07 NOTE — Patient Instructions (Addendum)
Explained breast self awareness with Wyn Forster. Patient did not need a Pap smear today due to last Pap smear was 10/22/2018. Let her know BCCCP will cover Pap smears every 3 years unless has a history of abnormal Pap smears. Referred patient to the [facility] for a diagnostic mammogram. Appointment scheduled Thursday, December 07, 2020 at 1240. Patient aware of appointment and will be there. Franne Grip Patricio verbalized understanding.  Pragya Lofaso, Kathaleen Maser, RN 11:44 AM

## 2020-12-07 NOTE — Progress Notes (Signed)
Ms. Enis Leatherwood is a 40 y.o. female who presents to [facility] clinic today with complaint of bilateral outer breast pain x 2 years when touched. Patient rates the pain at a 5 out of 10.    Pap Smear: Pap smear not completed today. Last Pap smear was 10/22/2018 at [facility] clinic and was normal. Per patient has no history of an abnormal Pap smear. Last Pap smear result is available in Epic.   Physical exam: Breasts Breasts symmetrical. No skin abnormalities bilateral breasts. No nipple retraction bilateral breasts. No nipple discharge bilateral breasts. No lymphadenopathy. No lumps palpated bilateral breasts. Complaints of left outer breast tenderness on exam.  MS DIGITAL DIAG TOMO BILAT  Result Date: 12/07/2020 CLINICAL DATA:  Focal pain in both breasts. EXAM: DIGITAL DIAGNOSTIC BILATERAL MAMMOGRAM WITH TOMOSYNTHESIS AND CAD; ULTRASOUND LEFT BREAST LIMITED; ULTRASOUND RIGHT BREAST LIMITED TECHNIQUE: Bilateral digital diagnostic mammography and breast tomosynthesis was performed. The images were evaluated with computer-aided detection.; Targeted ultrasound examination of the left breast was performed; Targeted ultrasound examination of the right breast was performed COMPARISON:  None. ACR Breast Density Category b: There are scattered areas of fibroglandular density. FINDINGS: An asymmetry in the slightly medial right breast at a mid depth resolves on additional imaging. No suspicious findings in either breast. On physical exam, no suspicious lumps are identified. Targeted ultrasound is performed, showing no abnormalities in the regions of the patient's focal pain. IMPRESSION: No mammographic or sonographic evidence of malignancy. RECOMMENDATION: Treatment of the patient's focal pain should be based on clinical and physical exam given lack of imaging findings. Recommend annual screening mammography. I have discussed the findings and recommendations with the patient. If applicable, a reminder  letter will be sent to the patient regarding the next appointment. BI-RADS CATEGORY  1: Negative. Electronically Signed   By: Gerome Sam III M.D   On: 12/07/2020 14:03       Pelvic/Bimanual Pap is not indicated today per BCCCP guidelines.    Smoking History: Patient has never smoked.   Patient Navigation: Patient education provided. Access to services provided for patient through [facility] program. Spanish interpreter Natale Lay from [facility] provided.    Breast and Cervical Cancer Risk Assessment: Patient does not have family history of breast cancer, known genetic mutations, or radiation treatment to the chest before age 68. Patient does not have history of cervical dysplasia, immunocompromised, or DES exposure in-utero.  Risk Assessment     Risk Scores       12/07/2020   Last edited by: Narda Rutherford, LPN   5-year risk: 0.4 %   Lifetime risk: 5.7 %            A: BCCCP exam without pap smear Complaint of bilateral outer breast pain.  P: Referred patient to the [facility] of [facility] for a diagnostic mammogram. Appointment scheduled Thursday, December 07, 2020 at 1240.  Priscille Heidelberg, RN 12/07/2020 11:43 AM

## 2021-03-29 ENCOUNTER — Ambulatory Visit: Payer: No Typology Code available for payment source | Admitting: Family Medicine

## 2021-03-31 ENCOUNTER — Encounter (HOSPITAL_COMMUNITY): Payer: Self-pay

## 2021-03-31 ENCOUNTER — Ambulatory Visit (HOSPITAL_COMMUNITY)
Admission: EM | Admit: 2021-03-31 | Discharge: 2021-03-31 | Disposition: A | Discharge status: Home / Self Care | Payer: Self-pay | Attending: Family Medicine | Admitting: Family Medicine

## 2021-03-31 ENCOUNTER — Other Ambulatory Visit: Payer: Self-pay

## 2021-03-31 DIAGNOSIS — H1033 Unspecified acute conjunctivitis, bilateral: Secondary | ICD-10-CM

## 2021-03-31 LAB — POCT URINALYSIS DIPSTICK, ED / UC
Bilirubin Urine: NEGATIVE
Glucose, UA: NEGATIVE mg/dL
Hgb urine dipstick: NEGATIVE
Ketones, ur: NEGATIVE mg/dL
Leukocytes,Ua: NEGATIVE
Nitrite: NEGATIVE
Protein, ur: NEGATIVE mg/dL
Specific Gravity, Urine: >=1.03 (ref 1.005–1.030)
Urobilinogen, UA: 0.2 mg/dL (ref 0.0–1.0)
pH: 5.5 (ref 5.0–8.0)

## 2021-03-31 LAB — POC URINE PREG, ED: Preg Test, Ur: NEGATIVE

## 2021-03-31 MED ORDER — POLYMYXIN B-TRIMETHOPRIM 10000-0.1 UNIT/ML-% OP SOLN: 1.0000 [drp] | Freq: Four times a day (QID) | OPHTHALMIC | 0 refills | Status: DC

## 2021-03-31 NOTE — ED Provider Notes (Signed)
[facility]    CSN: [id number] Arrival date & time: 03/31/21  1346      History   Chief Complaint Chief Complaint  Patient presents with   Eye Problem    HPI Autumn Benton is a 40 y.o. female.   Medical interpreter utilized today to facilitate visit with patient's consent.  Presenting today with 2 to 3-day history of bilateral eye irritation, redness, itching, thick green drainage.  Denies any recent exposures, injury to the eye, headache, nausea, vomiting, fevers, upper respiratory symptoms.  So far trying over-the-counter Visine drops with no relief.  Denies contact lens use.  No sick contacts.  Past Medical History:  Diagnosis Date   Arthritis 09/19/2016   Chlamydia    Dyslipidemia (high LDL; low HDL)    Miscarriage    Prediabetes    Patient Active Problem List   Diagnosis Date Noted   Class 1 obesity without serious comorbidity with body mass index (BMI) of 30.0 to 30.9 in adult 11/18/2018   Dyslipidemia (high LDL; low HDL)    Arm mass, left 10/01/2018   Prediabetes 10/30/2017   Arthritis 09/19/2016   History reviewed. No pertinent surgical history.  OB History     Gravida  7   Para  6   Term  6   Preterm      AB      Living  6      SAB      IAB      Ectopic      Multiple      Live Births  1            Home Medications    Prior to Admission medications   Medication Sig Start Date End Date Taking? Authorizing Provider  trimethoprim-polymyxin b (POLYTRIM) ophthalmic solution Place 1 drop into both eyes every 6 (six) hours. 03/31/21  Yes Particia Nearing, PA-C  etonogestrel (NEXPLANON) 68 MG IMPL implant 1 each by Subdermal route once. Last placed 04/2017 to be removed 04/2020    [provider]  fluconazole (DIFLUCAN) 150 MG tablet 1 tab by mouth daily for 2 days following completion of Metronidazole Patient not taking: No sig reported 11/23/18   Julieanne Manson, MD  hydrOXYzine (ATARAX/VISTARIL) 25  MG tablet Take 1 tablet (25 mg total) by mouth every 8 (eight) hours as needed. Patient not taking: Reported on 09/26/2020 06/26/20   Hoy Register, MD  ibuprofen (ADVIL,MOTRIN) 200 MG tablet 3 tabs by mouth twice daily with meals Patient not taking: No sig reported 08/01/17   Julieanne Manson, MD  metroNIDAZOLE (FLAGYL) 500 MG tablet 1 tab by mouth twice daily for 7 days. Patient not taking: No sig reported 11/23/18   Julieanne Manson, MD  Multiple Vitamin (MULTIVITAMIN) tablet Take 1 tablet by mouth daily. 09/26/20   Hoy Register, MD    Family History Family History  Problem Relation Age of Onset   Alcohol abuse Father        had kidney and liver disease at one point related to ETOH intake, but patient states those resolved.    Social History Social History   Tobacco Use   Smoking status: Never   Smokeless tobacco: Never  Vaping Use   Vaping Use: Never used  Substance Use Topics   Alcohol use: No   Drug use: Never     Allergies   Patient has no known allergies.   Review of Systems Review of Systems Per HPI  Physical Exam Triage Vital Signs  ED Triage Vitals [03/31/21 1526]  Enc Vitals Group     BP 115/73     Pulse Rate 76     Resp 17     Temp (!) 97.4 F (36.3 C)     Temp Source Oral     SpO2 97 %     Weight      Height      Head Circumference      Peak Flow      Pain Score 0     Pain Loc      Pain Edu?      Excl. in GC?    No data found.  Updated Vital Signs BP 115/73 (BP Location: Left Arm)   Pulse 76   Temp (!) 97.4 F (36.3 C) (Oral)   Resp 17   SpO2 97%   Visual Acuity Right Eye Distance: 20/20 (Without correction) Left Eye Distance: 20/20 (Without correction) Bilateral Distance: 20/20 (Without correction)  Right Eye Near:   Left Eye Near:    Bilateral Near:     Physical Exam Vitals and nursing note reviewed.  Constitutional:      Appearance: Normal appearance.  HENT:     Head: Atraumatic.     Nose: Nose normal.      Mouth/Throat:     Mouth: Mucous membranes are moist.     Pharynx: Oropharynx is clear.  Eyes:     General:        Right eye: Discharge present.        Left eye: Discharge present.    Extraocular Movements: Extraocular movements intact.     Pupils: Pupils are equal, round, and reactive to light.     Comments: Bilateral conjunctiva erythematous.  No foreign body on exam  Cardiovascular:     Rate and Rhythm: Normal rate and regular rhythm.     Heart sounds: Normal heart sounds.  Pulmonary:     Effort: Pulmonary effort is normal.     Breath sounds: Normal breath sounds. No wheezing or rales.  Musculoskeletal:        General: Normal range of motion.     Cervical back: Normal range of motion and neck supple.  Lymphadenopathy:     Cervical: No cervical adenopathy.  Skin:    General: Skin is warm and dry.     Findings: No erythema or rash.  Neurological:     Mental Status: She is alert.     Motor: No weakness.     Gait: Gait normal.  Psychiatric:        Mood and Affect: Mood normal.        Thought Content: Thought content normal.        Judgment: Judgment normal.   UC Treatments / Results  Labs (all labs ordered are listed, but only abnormal results are displayed) Labs Reviewed  POCT URINALYSIS DIPSTICK, ED / UC  POC URINE PREG, ED    EKG   Radiology No results found.  Procedures Procedures (including critical care time)  Medications Ordered in UC Medications - No data to display  Initial Impression / Assessment and Plan / UC Course  I have reviewed the triage vital signs and the nursing notes.  Pertinent labs & imaging results that were available during my care of the patient were reviewed by me and considered in my medical decision making (see chart for details).     Suspect bacterial conjunctivitis, will treat with Polytrim drops, warm compresses, good hand hygiene.  Work note given.  Return for acutely worsening symptoms. Final Clinical Impressions(s) / UC  Diagnoses   Final diagnoses:  Acute bacterial conjunctivitis of both eyes   Discharge Instructions   None    ED Prescriptions     Medication Sig Dispense Auth. Provider   trimethoprim-polymyxin b (POLYTRIM) ophthalmic solution Place 1 drop into both eyes every 6 (six) hours. 10 mL Particia Nearing, [place]      PDMP not reviewed this encounter.   Particia Nearing, [place] 03/31/21 1810

## 2021-03-31 NOTE — ED Triage Notes (Signed)
Pt reports redness, swollen and itching in eyes x 3 days.   Pt reports right lower quadrant abdominal pain x 1 month; left sided chest pain, states improved when she put pressure on x 15 days.

## 2021-05-11 ENCOUNTER — Telehealth: Payer: Self-pay | Admitting: Family Medicine

## 2021-05-11 NOTE — Telephone Encounter (Signed)
Copied from CRM [id number]. Topic: Appointment Scheduling - Scheduling Inquiry for Clinic >> May 10, 2021  2:19 PM Randol Kern wrote: Reason for CRM: Pt wants to renew her orange card  Best contact: [phone]

## 2021-05-11 NOTE — Telephone Encounter (Signed)
I return Pt call, schedule a financial appt 05/29/21

## 2021-05-29 ENCOUNTER — Ambulatory Visit: Payer: Self-pay

## 2021-06-24 ENCOUNTER — Emergency Department (HOSPITAL_COMMUNITY)
Admission: EM | Admit: 2021-06-24 | Discharge: 2021-06-25 | Disposition: A | Discharge status: Home / Self Care | Payer: Self-pay | Attending: Emergency Medicine | Admitting: Emergency Medicine

## 2021-06-24 ENCOUNTER — Emergency Department (HOSPITAL_COMMUNITY): Payer: Self-pay

## 2021-06-24 ENCOUNTER — Encounter (HOSPITAL_COMMUNITY): Payer: Self-pay

## 2021-06-24 ENCOUNTER — Other Ambulatory Visit: Payer: Self-pay

## 2021-06-24 DIAGNOSIS — U071 COVID-19: Secondary | ICD-10-CM | POA: Insufficient documentation

## 2021-06-24 DIAGNOSIS — H9203 Otalgia, bilateral: Secondary | ICD-10-CM | POA: Insufficient documentation

## 2021-06-24 LAB — URINALYSIS, ROUTINE W REFLEX MICROSCOPIC
Bilirubin Urine: NEGATIVE
Glucose, UA: NEGATIVE mg/dL
Hgb urine dipstick: NEGATIVE
Ketones, ur: NEGATIVE mg/dL
Leukocytes,Ua: NEGATIVE
Nitrite: NEGATIVE
Protein, ur: NEGATIVE mg/dL
Specific Gravity, Urine: 1.02 (ref 1.005–1.030)
pH: 8.5 — ABNORMAL HIGH (ref 5.0–8.0)

## 2021-06-24 LAB — CBC WITH DIFFERENTIAL/PLATELET
Abs Immature Granulocytes: 0.05 10*3/uL (ref 0.00–0.07)
Basophils Absolute: 0 10*3/uL (ref 0.0–0.1)
Basophils Relative: 0 %
Eosinophils Absolute: 0.1 10*3/uL (ref 0.0–0.5)
Eosinophils Relative: 1 %
HCT: 41.6 % (ref 36.0–46.0)
Hemoglobin: 14.2 g/dL (ref 12.0–15.0)
Immature Granulocytes: 1 %
Lymphocytes Relative: 13 %
Lymphs Abs: 1.3 10*3/uL (ref 0.7–4.0)
MCH: 30.5 pg (ref 26.0–34.0)
MCHC: 34.1 g/dL (ref 30.0–36.0)
MCV: 89.5 fL (ref 80.0–100.0)
Monocytes Absolute: 0.9 10*3/uL (ref 0.1–1.0)
Monocytes Relative: 9 %
Neutro Abs: 8 10*3/uL — ABNORMAL HIGH (ref 1.7–7.7)
Neutrophils Relative %: 76 %
Platelets: 216 10*3/uL (ref 150–400)
RBC: 4.65 MIL/uL (ref 3.87–5.11)
RDW: 11.9 % (ref 11.5–15.5)
WBC: 10.3 10*3/uL (ref 4.0–10.5)
nRBC: 0 % (ref 0.0–0.2)

## 2021-06-24 LAB — COMPREHENSIVE METABOLIC PANEL
ALT: 68 U/L — ABNORMAL HIGH (ref 0–44)
AST: 30 U/L (ref 15–41)
Albumin: 4 g/dL (ref 3.5–5.0)
Alkaline Phosphatase: 55 U/L (ref 38–126)
Anion gap: 9 (ref 5–15)
BUN: 8 mg/dL (ref 6–20)
CO2: 22 mmol/L (ref 22–32)
Calcium: 9 mg/dL (ref 8.9–10.3)
Chloride: 102 mmol/L (ref 98–111)
Creatinine, Ser: 0.69 mg/dL (ref 0.44–1.00)
GFR, Estimated: >60 mL/min (ref >60)
Glucose, Bld: 95 mg/dL (ref 70–99)
Potassium: 3.6 mmol/L (ref 3.5–5.1)
Sodium: 133 mmol/L — ABNORMAL LOW (ref 135–145)
Total Bilirubin: 1 mg/dL (ref 0.3–1.2)
Total Protein: 7.3 g/dL (ref 6.5–8.1)

## 2021-06-24 LAB — RESP PANEL BY RT-PCR (FLU A&B, COVID) ARPGX2
Influenza A by PCR: NEGATIVE
Influenza B by PCR: NEGATIVE
SARS Coronavirus 2 by RT PCR: POSITIVE — AB

## 2021-06-24 LAB — I-STAT BETA HCG BLOOD, ED (MC, WL, AP ONLY): I-stat hCG, quantitative: <5 m[IU]/mL (ref <5)

## 2021-06-24 MED ORDER — ACETAMINOPHEN 325 MG PO TABS
650.0000 mg | ORAL_TABLET | Freq: Once | ORAL | Status: AC | PRN
  Administered 2021-06-24: 650 mg via ORAL
  Filled 2021-06-24: qty 2

## 2021-06-24 NOTE — ED Triage Notes (Signed)
Pt presents to the ED from home with complaints of abd pain, N/V/D onset Thursday, headache, haven't felt well since then. Fever today, bilateral ear pain. Pt taking Contac at home for symptoms.

## 2021-06-25 ENCOUNTER — Encounter: Payer: Self-pay | Admitting: Family Medicine

## 2021-06-25 MED ORDER — IBUPROFEN 400 MG PO TABS: 400.0000 mg | ORAL_TABLET | Freq: Four times a day (QID) | ORAL | 0 refills | Status: DC | PRN

## 2021-06-25 MED ORDER — IBUPROFEN 400 MG PO TABS
400.0000 mg | ORAL_TABLET | Freq: Once | ORAL | Status: AC
  Administered 2021-06-25: 400 mg via ORAL
  Filled 2021-06-25: qty 1

## 2021-06-25 MED ORDER — HYDROCODONE BIT-HOMATROP MBR 5-1.5 MG/5ML PO SOLN
5.0000 mL | Freq: Once | ORAL | Status: AC
  Administered 2021-06-25: 5 mL via ORAL
  Filled 2021-06-25: qty 5

## 2021-06-25 MED ORDER — HYDROCODONE BIT-HOMATROP MBR 5-1.5 MG/5ML PO SOLN: 5.0000 mL | Freq: Four times a day (QID) | ORAL | 0 refills | Status: DC | PRN

## 2021-06-25 NOTE — ED Notes (Signed)
Patient moved to appropriate seating area 

## 2021-06-25 NOTE — ED Provider Notes (Signed)
[facility] EMERGENCY DEPARTMENT Provider Note   CSN: [id number] Arrival date & time: 06/24/21  2206     History  Chief Complaint  Patient presents with   Fever    Autumn Benton is a 41 y.o. female.  42 year old female with respiratory symptoms for last 4 days.  Cough, body aches, fever, congestion.  Couple days after she has sick her husband started get sick.  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  States that she started having bilateral ear pain as well.  Started at home COVID test was negative   Fever Max temp prior to arrival:  102 Temp source:  Oral Severity:  Moderate     Home Medications Prior to Admission medications   Medication Sig Start Date End Date Taking? Authorizing Provider  etonogestrel (NEXPLANON) 68 MG IMPL implant 1 each by Subdermal route once. Last placed 04/2017 to be removed 04/2020    [provider]  HYDROcodone bit-homatropine (HYCODAN) 5-1.5 MG/5ML syrup Take 5 mLs by mouth every 6 (six) hours as needed for cough. 06/25/21   Darrian Grzelak, Barbara Cower, MD  ibuprofen (ADVIL) 400 MG tablet Take 1 tablet (400 mg total) by mouth every 6 (six) hours as needed for fever. 06/25/21   Flora Ratz, Barbara Cower, MD  Multiple Vitamin (MULTIVITAMIN) tablet Take 1 tablet by mouth daily. 09/26/20   Hoy Register, MD  trimethoprim-polymyxin b (POLYTRIM) ophthalmic solution Place 1 drop into both eyes every 6 (six) hours. 03/31/21   Particia Nearing, PA-C      Allergies    Patient has no known allergies.    Review of Systems   Review of Systems  Constitutional:  Positive for fever.   Physical Exam Updated Vital Signs BP 125/78 (BP Location: Left Arm)    Pulse 91    Temp 100.2 F (37.9 C) (Oral)    Resp 15    Ht 5\' 4"  (1.626 m)    Wt 68 kg    SpO2 100%    BMI 25.73 kg/m  Physical Exam Vitals and nursing note reviewed.  Constitutional:      Appearance: She is well-developed.  HENT:     Head:  Normocephalic and atraumatic.     Nose: No congestion or rhinorrhea.     Mouth/Throat:     Mouth: Mucous membranes are moist.     Pharynx: Oropharynx is clear.  Eyes:     Pupils: Pupils are equal, round, and reactive to light.  Cardiovascular:     Rate and Rhythm: Normal rate and regular rhythm.  Pulmonary:     Effort: No respiratory distress.     Breath sounds: No stridor.  Abdominal:     General: Abdomen is flat. There is no distension.  Musculoskeletal:        General: No swelling or tenderness. Normal range of motion.     Cervical back: Normal range of motion.  Skin:    General: Skin is warm and dry.  Neurological:     General: No focal deficit present.     Mental Status: She is alert.    ED Results / Procedures / Treatments   Labs (all labs ordered are listed, but only abnormal results are displayed) Labs Reviewed  RESP PANEL BY RT-PCR (FLU A&B, COVID) ARPGX2 - Abnormal; Notable for the following components:      Result Value   SARS Coronavirus 2 by RT PCR POSITIVE (*)    All other components  within normal limits  COMPREHENSIVE METABOLIC PANEL - Abnormal; Notable for the following components:   Sodium 133 (*)    ALT 68 (*)    All other components within normal limits  CBC WITH DIFFERENTIAL/PLATELET - Abnormal; Notable for the following components:   Neutro Abs 8.0 (*)    All other components within normal limits  URINALYSIS, ROUTINE W REFLEX MICROSCOPIC - Abnormal; Notable for the following components:   pH 8.5 (*)    Bacteria, UA RARE (*)    All other components within normal limits  I-STAT BETA HCG BLOOD, ED ([facility], [facility], [facility] ONLY)    EKG None  Radiology DG Chest 2 View  Result Date: 06/24/2021 CLINICAL DATA:  Cough EXAM: CHEST - 2 VIEW COMPARISON:  None. FINDINGS: The heart size and mediastinal contours are within normal limits. Both lungs are clear. The visualized skeletal structures are unremarkable. IMPRESSION: No active cardiopulmonary disease. Electronically  Signed   By: Deatra Robinson M.D.   On: 06/24/2021 23:45    Procedures Procedures   Medications Ordered in ED Medications  acetaminophen (TYLENOL) tablet 650 mg (650 mg Oral Given 06/24/21 2229)  HYDROcodone bit-homatropine (HYCODAN) 5-1.5 MG/5ML syrup 5 mL (5 mLs Oral Given 06/25/21 0349)  ibuprofen (ADVIL) tablet 400 mg (400 mg Oral Given 06/25/21 0349)    ED Course/ Medical Decision Making/ A&P                           Medical Decision Making Amount and/or Complexity of Data Reviewed Labs: ordered. Radiology: ordered.  Risk OTC drugs. Prescription drug management.   COVID test positive.  No GI symptoms.  No hypoxia or respiratory distress to suggest need for hospitalization.  She is outside the window and also not a candidate for any type of immune therapy.  We will give her a work note and offer symptomatic care at home.   Final Clinical Impression(s) / ED Diagnoses Final diagnoses:  COVID-19    Rx / DC Orders ED Discharge Orders          Ordered    HYDROcodone bit-homatropine (HYCODAN) 5-1.5 MG/5ML syrup  Every 6 hours PRN,   Status:  Discontinued        06/25/21 0348    ibuprofen (ADVIL) 400 MG tablet  Every 6 hours PRN,   Status:  Discontinued        06/25/21 0348    HYDROcodone bit-homatropine (HYCODAN) 5-1.5 MG/5ML syrup  Every 6 hours PRN        06/25/21 0424    ibuprofen (ADVIL) 400 MG tablet  Every 6 hours PRN        06/25/21 0424              Justice Milliron, Barbara Cower, MD 06/25/21 [phone]

## 2021-06-29 ENCOUNTER — Ambulatory Visit: Payer: Self-pay

## 2021-07-24 ENCOUNTER — Inpatient Hospital Stay: Payer: Self-pay | Admitting: Family Medicine

## 2021-07-31 ENCOUNTER — Ambulatory Visit: Payer: Self-pay | Attending: Family Medicine | Admitting: Family Medicine

## 2021-07-31 ENCOUNTER — Other Ambulatory Visit: Payer: Self-pay

## 2021-07-31 ENCOUNTER — Encounter: Payer: Self-pay | Admitting: Family Medicine

## 2021-07-31 ENCOUNTER — Other Ambulatory Visit (HOSPITAL_COMMUNITY)
Admission: RE | Admit: 2021-07-31 | Discharge: 2021-07-31 | Disposition: A | Discharge status: Home / Self Care | Payer: Self-pay | Source: Ambulatory Visit | Attending: Family Medicine | Admitting: Family Medicine

## 2021-07-31 VITALS — BP 118/76 | HR 78 | Ht 64.0 in | Wt 174.4 lb

## 2021-07-31 DIAGNOSIS — Z124 Encounter for screening for malignant neoplasm of cervix: Secondary | ICD-10-CM | POA: Insufficient documentation

## 2021-07-31 DIAGNOSIS — Z13228 Encounter for screening for other metabolic disorders: Secondary | ICD-10-CM

## 2021-07-31 DIAGNOSIS — Z0000 Encounter for general adult medical examination without abnormal findings: Secondary | ICD-10-CM

## 2021-07-31 DIAGNOSIS — Z1231 Encounter for screening mammogram for malignant neoplasm of breast: Secondary | ICD-10-CM

## 2021-07-31 DIAGNOSIS — U099 Post covid-19 condition, unspecified: Secondary | ICD-10-CM

## 2021-07-31 DIAGNOSIS — R300 Dysuria: Secondary | ICD-10-CM

## 2021-07-31 DIAGNOSIS — Z1159 Encounter for screening for other viral diseases: Secondary | ICD-10-CM

## 2021-07-31 LAB — POCT URINALYSIS DIP (CLINITEK)
Bilirubin, UA: NEGATIVE
Blood, UA: NEGATIVE
Glucose, UA: NEGATIVE mg/dL
Ketones, POC UA: NEGATIVE mg/dL
Leukocytes, UA: NEGATIVE
Nitrite, UA: NEGATIVE
POC PROTEIN,UA: NEGATIVE
Spec Grav, UA: 1.02 (ref 1.010–1.025)
Urobilinogen, UA: 0.2 E.U./dL
pH, UA: 7 (ref 5.0–8.0)

## 2021-07-31 NOTE — Progress Notes (Signed)
? ?Subjective:  ?Patient ID: Autumn Benton, female    DOB: Mar 08, 1981  Age: 41 y.o. MRN: [id number] ? ?CC: Annual Exam ? ? ?HPI ?Autumn Benton is a 41 y.o. year old female who presents today for an annual physical exam. ? ?Interval History: ? ?She has had lower abdominal pain but no n/v/d/c.Marland Kitchen It is sometimes severe ; she does have periods as she has Nexplanon n place. She has dysuria but no frequency or flank pain. ? ? ?She complains of dizziness, headaches, eye pain since she had COVID a month ago and was told she needed to take vitamin C and multivitamins which she has been doing. ?She is due for mammogram and Pap smear today. ?Past Medical History:  ?Diagnosis Date  ? Arthritis 09/19/2016  ? Chlamydia   ? Dyslipidemia (high LDL; low HDL)   ? Miscarriage   ? Prediabetes   ? ? ?No past surgical history on file. ? ?Family History  ?Problem Relation Age of Onset  ? Alcohol abuse Father   ?     had kidney and liver disease at one point related to ETOH intake, but patient states those resolved.  ? ? ?No Known Allergies ? ?Outpatient Medications Prior to Visit  ?Medication Sig Dispense Refill  ? etonogestrel (NEXPLANON) 68 MG IMPL implant 1 each by Subdermal route once. Last placed 04/2017 to be removed 04/2020    ? ibuprofen (ADVIL) 400 MG tablet Take 1 tablet (400 mg total) by mouth every 6 (six) hours as needed for fever. 30 tablet 0  ? Multiple Vitamin (MULTIVITAMIN) tablet Take 1 tablet by mouth daily. 30 tablet 3  ? HYDROcodone bit-homatropine (HYCODAN) 5-1.5 MG/5ML syrup Take 5 mLs by mouth every 6 (six) hours as needed for cough. 120 mL 0  ? trimethoprim-polymyxin b (POLYTRIM) ophthalmic solution Place 1 drop into both eyes every 6 (six) hours. 10 mL 0  ? ?No facility-administered medications prior to visit.  ? ? ? ?ROS ?Review of Systems  ?Constitutional:  Negative for activity change, appetite change and fatigue.  ?HENT:  Negative for congestion, sinus pressure and sore throat.   ?Eyes:   Negative for visual disturbance.  ?Respiratory:  Negative for cough, chest tightness, shortness of breath and wheezing.   ?Cardiovascular:  Negative for chest pain and palpitations.  ?Gastrointestinal:  Positive for abdominal pain. Negative for abdominal distention and constipation.  ?Endocrine: Negative for polydipsia.  ?Genitourinary:  Negative for dysuria and frequency.  ?Musculoskeletal:  Negative for arthralgias and back pain.  ?Skin:  Negative for rash.  ?Neurological:  Positive for dizziness and headaches. Negative for tremors, light-headedness and numbness.  ?Hematological:  Does not bruise/bleed easily.  ?Psychiatric/Behavioral:  Negative for agitation and behavioral problems.   ? ?Objective:  ?BP 118/76   Pulse 78   Ht 5\' 4"  (1.626 m)   Wt 174 lb 6.4 oz (79.1 kg)   SpO2 99%   BMI 29.94 kg/m?  ? ?BP/Weight 07/31/2021 06/25/2021 06/24/2021  ?Systolic BP 118 125 -  ?Diastolic BP 76 78 -  ?Wt. (Lbs) 174.4 - 149.91  ?BMI 29.94 - 25.73  ? ? ? ? ?Physical Exam ?Exam conducted with a chaperone present.  ?Constitutional:   ?   General: She is not in acute distress. ?   Appearance: She is well-developed. She is not diaphoretic.  ?HENT:  ?   Head: Normocephalic.  ?   Right Ear: External ear normal.  ?   Left Ear: External ear normal.  ?   Nose:  Nose normal.  ?Eyes:  ?   Conjunctiva/sclera: Conjunctivae normal.  ?   Pupils: Pupils are equal, round, and reactive to light.  ?Neck:  ?   Vascular: No JVD.  ?Cardiovascular:  ?   Rate and Rhythm: Normal rate and regular rhythm.  ?   Heart sounds: Normal heart sounds. No murmur heard. ?  No gallop.  ?Pulmonary:  ?   Effort: Pulmonary effort is normal. No respiratory distress.  ?   Breath sounds: Normal breath sounds. No wheezing or rales.  ?Chest:  ?   Chest wall: No tenderness.  ?Breasts: ?   Right: Normal. No mass, nipple discharge or tenderness.  ?   Left: Normal. No mass, nipple discharge or tenderness.  ?Abdominal:  ?   General: Bowel sounds are normal. There is no  distension.  ?   Palpations: Abdomen is soft. There is no mass.  ?   Tenderness: There is no abdominal tenderness. There is no right CVA tenderness or left CVA tenderness.  ?   Hernia: There is no hernia in the left inguinal area or right inguinal area.  ?Genitourinary: ?   General: Normal vulva.  ?   Pubic Area: No rash.   ?   Labia:     ?   Right: No rash.     ?   Left: No rash.   ?   Vagina: Normal.  ?   Cervix: Normal.  ?   Uterus: Normal.   ?   Adnexa: Right adnexa normal and left adnexa normal.    ?   Right: No tenderness.      ?   Left: No tenderness.    ?Musculoskeletal:     ?   General: No tenderness. Normal range of motion.  ?   Cervical back: Normal range of motion. No tenderness.  ?Lymphadenopathy:  ?   Upper Body:  ?   Right upper body: No supraclavicular or axillary adenopathy.  ?   Left upper body: No supraclavicular or axillary adenopathy.  ?Skin: ?   General: Skin is warm and dry.  ?Neurological:  ?   Mental Status: She is alert and oriented to person, place, and time.  ?   Deep Tendon Reflexes: Reflexes are normal and symmetric.  ? ? ?CMP Latest Ref Rng & Units 06/24/2021 09/27/2020 10/01/2018  ?Glucose 70 - 99 mg/dL 95 94 89  ?BUN 6 - 20 mg/dL 8 10 11   ?Creatinine 0.44 - 1.00 mg/dL 7.32 2.02  ?Sodium 135 - 145 mmol/L 133(L) 142 141  ?Potassium 3.5 - 5.1 mmol/L 3.6 4.0 3.8  ?Chloride 98 - 111 mmol/L 102 107(H) 106  ?CO2 22 - 32 mmol/L 22 21 21   ?Calcium 8.9 - 10.3 mg/dL 9.0 9.1 9.3  ?Total Protein 6.5 - 8.1 g/dL 7.3 7.1 7.2  ?Total Bilirubin 0.3 - 1.2 mg/dL 1.0 0.8 1.0  ?Alkaline Phos 38 - 126 U/L 55 71 62  ?AST 15 - 41 U/L 30 15 16   ?ALT 0 - 44 U/L 68(H) 22 22  ? ? ?Lipid Panel  ?   ?Component Value Date/Time  ? CHOL 211 (H) 09/27/2020 0840  ? TRIG 75 09/27/2020 0840  ? HDL 44 09/27/2020 0840  ? CHOLHDL 4.8 (H) 09/27/2020 0840  ? LDLCALC 154 (H) 09/27/2020 0840  ? ? ?CBC ?   ?Component Value Date/Time  ? WBC 10.3 06/24/2021 2233  ? RBC 4.65 06/24/2021 2233  ? HGB 14.2 06/24/2021 2233  ? HGB 14.0  10/01/2018 1017  ?  HCT 41.6 06/24/2021 2233  ? HCT 40.6 10/01/2018 1017  ? PLT 216 06/24/2021 2233  ? PLT 206 10/01/2018 1017  ? MCV 89.5 06/24/2021 2233  ? MCV 89 10/01/2018 1017  ? MCH 30.5 06/24/2021 2233  ? MCHC 34.1 06/24/2021 2233  ? RDW 11.9 06/24/2021 2233  ? RDW 12.2 10/01/2018 1017  ? LYMPHSABS 1.3 06/24/2021 2233  ? LYMPHSABS 1.8 10/01/2018 1017  ? MONOABS 0.9 06/24/2021 2233  ? EOSABS 0.1 06/24/2021 2233  ? EOSABS 0.2 10/01/2018 1017  ? BASOSABS 0.0 06/24/2021 2233  ? BASOSABS 0.1 10/01/2018 1017  ? ? ?Lab Results  ?Component Value Date  ? HGBA1C 5.8 (A) 09/26/2020  ? ? ?Assessment & Plan:  ?1. Annual physical exam ?Counseled on 150 minutes of exercise per week, healthy eating (including decreased daily intake of saturated fats, cholesterol, added sugars, sodium), STI prevention, routine healthcare maintenance. ? ? ?2. Burning with urination ?UA neg for UTI ?- POCT URINALYSIS DIP (CLINITEK) ? ?3. Encounter for screening mammogram for malignant neoplasm of breast ?- MM 3D SCREEN BREAST BILATERAL; Future ? ?4. Screening for cervical cancer ?- Cytology - PAP ? ?5. Need for hepatitis C screening test ?- HCV Ab w Reflex to Quant PCR; Future ? ?6. Screening for metabolic disorder ?- Hemoglobin A1c; Future ?- LP+Non-HDL Cholesterol; Future ? ?7. Post-COVID syndrome ?Advised to use analgesics as needed, rest, increase fluid intake and continued vitamins ? ? ? ? ?No orders of the defined types were placed in this encounter. ? ? ?Follow-up: Return in about 1 year (around 08/01/2022) for Preventive Health Exam.  ? ? ? ? ? ?Hoy Register, MD, FAAFP. ?[facility] ?[place], [place] ?[phone]   ?07/31/2021, 1:04 PM ?

## 2021-07-31 NOTE — Patient Instructions (Signed)

## 2021-07-31 NOTE — Progress Notes (Signed)
Discuss side effects from covid. ?Lower abdominal pain. ?

## 2021-08-01 ENCOUNTER — Other Ambulatory Visit: Payer: Self-pay

## 2021-08-02 LAB — CYTOLOGY - PAP
Comment: NEGATIVE
Diagnosis: NEGATIVE
High risk HPV: NEGATIVE

## 2021-09-05 ENCOUNTER — Encounter (HOSPITAL_COMMUNITY): Payer: Self-pay | Admitting: Emergency Medicine

## 2021-09-05 ENCOUNTER — Other Ambulatory Visit: Payer: Self-pay

## 2021-09-05 ENCOUNTER — Ambulatory Visit (HOSPITAL_COMMUNITY)
Admission: EM | Admit: 2021-09-05 | Discharge: 2021-09-05 | Disposition: A | Discharge status: Home / Self Care | Payer: Self-pay | Attending: Family Medicine | Admitting: Family Medicine

## 2021-09-05 DIAGNOSIS — J020 Streptococcal pharyngitis: Secondary | ICD-10-CM

## 2021-09-05 LAB — POCT RAPID STREP A, ED / UC: Streptococcus, Group A Screen (Direct): POSITIVE — AB

## 2021-09-05 MED ORDER — IBUPROFEN 800 MG PO TABS: 800.0000 mg | ORAL_TABLET | Freq: Three times a day (TID) | ORAL | 0 refills | Status: DC | PRN

## 2021-09-05 MED ORDER — AMOXICILLIN 875 MG PO TABS: 875.0000 mg | ORAL_TABLET | Freq: Two times a day (BID) | ORAL | 0 refills | Status: AC

## 2021-09-05 NOTE — ED Triage Notes (Signed)
Pt reports fever, headache and sore throat x 2 days.  ?

## 2021-09-05 NOTE — ED Provider Notes (Signed)
?[facility] ? ? ? ?CSN: [id number] ?Arrival date & time: 09/05/21  1756 ? ? ?  ? ?History   ?Chief Complaint ?Chief Complaint  ?Patient presents with  ? Sore Throat  ? Headache  ? ? ?HPI ?Autumn Benton is a 41 y.o. female.  ? ? ?Sore Throat ?Associated symptoms include headaches.  ?Headache ?Here for sore throat and fever and h/a since 4/8 or 4/9. No cough or congestion. Left ear hurts a little. No n/v/d.  ? ?Has had some sharp pleuritic cp for a while in left upper chest.  ? ?Past Medical History:  ?Diagnosis Date  ? Arthritis 09/19/2016  ? Chlamydia   ? Dyslipidemia (high LDL; low HDL)   ? Miscarriage   ? Prediabetes   ? ? ?Patient Active Problem List  ? Diagnosis Date Noted  ? Class 1 obesity without serious comorbidity with body mass index (BMI) of 30.0 to 30.9 in adult 11/18/2018  ? Dyslipidemia (high LDL; low HDL)   ? Arm mass, left 10/01/2018  ? Prediabetes 10/30/2017  ? Arthritis 09/19/2016  ? ? ?History reviewed. No pertinent surgical history. ? ?OB History   ? ? Gravida  ?7  ? Para  ?6  ? Term  ?6  ? Preterm  ?   ? AB  ?   ? Living  ?6  ?  ? ? SAB  ?   ? IAB  ?   ? Ectopic  ?   ? Multiple  ?   ? Live Births  ?1  ?   ?  ?  ? ? ? ?Home Medications   ? ?Prior to Admission medications   ?Medication Sig Start Date End Date Taking? Authorizing Provider  ?amoxicillin (AMOXIL) 875 MG tablet Take 1 tablet (875 mg total) by mouth 2 (two) times daily for 10 days. 09/05/21 09/15/21 Yes Mikle Sternberg, Gwenlyn Perking, MD  ?ibuprofen (ADVIL) 800 MG tablet Take 1 tablet (800 mg total) by mouth every 8 (eight) hours as needed (pain). 09/05/21  Yes Barrett Henle, MD  ?etonogestrel (NEXPLANON) 68 MG IMPL implant 1 each by Subdermal route once. Last placed 04/2017 to be removed 04/2020    [provider]  ?Multiple Vitamin (MULTIVITAMIN) tablet Take 1 tablet by mouth daily. 09/26/20   Charlott Rakes, MD  ? ? ?Family History ?Family History  ?Problem Relation Age of Onset  ? Alcohol abuse Father   ?     had  kidney and liver disease at one point related to ETOH intake, but patient states those resolved.  ? ? ?Social History ?Social History  ? ?Tobacco Use  ? Smoking status: Never  ? Smokeless tobacco: Never  ?Vaping Use  ? Vaping Use: Never used  ?Substance Use Topics  ? Alcohol use: No  ? Drug use: Never  ? ? ? ?Allergies   ?Patient has no known allergies. ? ? ?Review of Systems ?Review of Systems  ?Neurological:  Positive for headaches.  ? ? ?Physical Exam ?Triage Vital Signs ?ED Triage Vitals  ?Enc Vitals Group  ?   BP 09/05/21 1947 118/81  ?   Pulse Rate 09/05/21 1947 91  ?   Resp 09/05/21 1947 17  ?   Temp 09/05/21 1947 98.7 ?F (37.1 ?C)  ?   Temp Source 09/05/21 1947 Oral  ?   SpO2 09/05/21 1947 99 %  ?   Weight 09/05/21 1945 174 lb 6.1 oz (79.1 kg)  ?   Height 09/05/21 1945 5\' 4"  (1.626 m)  ?  Head Circumference --   ?   Peak Flow --   ?   Pain Score 09/05/21 1944 9  ?   Pain Loc --   ?   Pain Edu? --   ?   Excl. in [place]? --   ? ?No data found. ? ?Updated Vital Signs ?BP 118/81 (BP Location: Right Arm)   Pulse 91   Temp 98.7 ?F (37.1 ?C) (Oral)   Resp 17   Ht 5\' 4"  (1.626 m)   Wt 79.1 kg   SpO2 99%   BMI 29.93 kg/m?  ? ?Visual Acuity ?Right Eye Distance:   ?Left Eye Distance:   ?Bilateral Distance:   ? ?Right Eye Near:   ?Left Eye Near:    ?Bilateral Near:    ? ?Physical Exam ?Vitals reviewed.  ?Constitutional:   ?   General: She is not in acute distress. ?   Appearance: She is not toxic-appearing.  ?HENT:  ?   Right Ear: Tympanic membrane and ear canal normal.  ?   Left Ear: Tympanic membrane and ear canal normal.  ?   Nose: Nose normal.  ?   Mouth/Throat:  ?   Mouth: Mucous membranes are moist.  ?   Comments: There is erythema, white exudate in the tonsillar crypts, and tonsillar hypertrophy 2+ ?Eyes:  ?   Extraocular Movements: Extraocular movements intact.  ?   Conjunctiva/sclera: Conjunctivae normal.  ?   Pupils: Pupils are equal, round, and reactive to light.  ?Cardiovascular:  ?   Rate and Rhythm:  Normal rate and regular rhythm.  ?   Heart sounds: No murmur heard. ?Pulmonary:  ?   Effort: Pulmonary effort is normal. No respiratory distress.  ?   Breath sounds: No wheezing, rhonchi or rales.  ?Chest:  ?   Chest wall: Tenderness (left anterior chest about 4th intercostal space) present.  ?Musculoskeletal:  ?   Cervical back: Neck supple.  ?Lymphadenopathy:  ?   Cervical: No cervical adenopathy.  ?Skin: ?   Capillary Refill: Capillary refill takes less than 2 seconds.  ?   Coloration: Skin is not jaundiced or pale.  ?Neurological:  ?   General: No focal deficit present.  ?   Mental Status: She is alert and oriented to person, place, and time.  ?Psychiatric:     ?   Behavior: Behavior normal.  ? ? ? ?UC Treatments / Results  ?Labs ?(all labs ordered are listed, but only abnormal results are displayed) ?Labs Reviewed  ?POCT RAPID STREP A, ED / UC - Abnormal; Notable for the following components:  ?    Result Value  ? Streptococcus, Group A Screen (Direct) POSITIVE (*)   ? All other components within normal limits  ? ? ?EKG ? ? ?Radiology ?No results found. ? ?Procedures ?Procedures (including critical care time) ? ?Medications Ordered in UC ?Medications - No data to display ? ?Initial Impression / Assessment and Plan / UC Course  ?I have reviewed the triage vital signs and the nursing notes. ? ?Pertinent labs & imaging results that were available during my care of the patient were reviewed by me and considered in my medical decision making (see chart for details). ? ?  ? ?Strep test is positive. ?Final Clinical Impressions(s) / UC Diagnoses  ? ?Final diagnoses:  ?Strep pharyngitis  ? ? ? ?Discharge Instructions   ? ?  ?The strep test was positive.(La prueba de strep fue positiva) ? ?Take amoxicillin 875 mg--1 tab twice daily for 10 days (  tome amoxicillin 875 mg--1 tab 2 veces al dia por 10 dias) ? ?You are not contagious after 24 hours of taking the antibiotics.(Despues de tomar el antibiotico por 24 horas, Ud no  est contagiosa) ? ? ?Change out your toothbrush after 2-3 days of taking the antibiotics.(Despues de 2-3 dias de antibiotico, tira su cepillo de dientes y use un nuevo) ? ? ? ? ?ED Prescriptions   ? ? Medication Sig Dispense Auth. Provider  ? amoxicillin (AMOXIL) 875 MG tablet Take 1 tablet (875 mg total) by mouth 2 (two) times daily for 10 days. 20 tablet Barrett Henle, MD  ? ibuprofen (ADVIL) 800 MG tablet Take 1 tablet (800 mg total) by mouth every 8 (eight) hours as needed (pain). 21 tablet Windy Carina Gwenlyn Perking, MD  ? ?  ? ?PDMP not reviewed this encounter. ?  ?Barrett Henle, MD ?09/05/21 2010 ? ?

## 2021-09-05 NOTE — Discharge Instructions (Addendum)
The strep test was positive.(La prueba de strep fue positiva) ? ?Take amoxicillin 875 mg--1 tab twice daily for 10 days (tome amoxicillin 875 mg--1 tab 2 veces al dia por 10 dias) ? ?You are not contagious after 24 hours of taking the antibiotics.(Despues de tomar el antibiotico por 24 horas, Ud no est contagiosa) ? ? ?Change out your toothbrush after 2-3 days of taking the antibiotics.(Despues de 2-3 dias de antibiotico, tira su cepillo de dientes y use un nuevo) ?

## 2021-12-20 ENCOUNTER — Ambulatory Visit
Admission: RE | Admit: 2021-12-20 | Discharge: 2021-12-20 | Disposition: A | Discharge status: Home / Self Care | Payer: No Typology Code available for payment source | Source: Ambulatory Visit | Attending: Family Medicine | Admitting: Family Medicine

## 2021-12-20 DIAGNOSIS — Z1231 Encounter for screening mammogram for malignant neoplasm of breast: Secondary | ICD-10-CM

## 2022-03-21 ENCOUNTER — Ambulatory Visit: Payer: Self-pay

## 2022-03-21 NOTE — Telephone Encounter (Signed)
Using [employer] [phone] .  Chief Complaint: Chest Pain Symptoms: Hurts when taking a deep breath to the center of the chest down the left arm Frequency: Ongoing since last week, getting worse Pertinent Negatives: Patient denies N/A Disposition: [x] ED /[] Urgent Care (no appt availability in office) / [] Appointment(In office/virtual)/ []  [facility] [place] [facility]/ [] Home Care/ [] Refused Recommended Disposition /[]  Mobile Bus/ []  Follow-up with PCP Additional Notes: Patient advised ED for evaluation of chest pain, she says she went to an UC last week and was there all night without being seen, she had to leave. Advised to go to an ED because with the pain in the chest down the arm, she could be having a heart attack and the ED is the best place, not an OV. She says she will see if her husband can take her or she will go somewhere she can be seen.    Reason for Disposition  SEVERE chest pain  Answer Assessment - Initial Assessment Questions 1. LOCATION: "Where does it hurt?"       Pain to the front on top of the breast 2. RADIATION: "Does the pain go anywhere else?" (e.g., into neck, jaw, arms, back) 3.  Down on the left arm and into the back  Protocols used: Chest Pain-A-AH

## 2022-05-01 ENCOUNTER — Ambulatory Visit: Payer: Self-pay

## 2022-05-01 ENCOUNTER — Ambulatory Visit: Payer: No Typology Code available for payment source | Admitting: Physician Assistant

## 2022-05-01 ENCOUNTER — Telehealth: Payer: Self-pay

## 2022-05-01 NOTE — Telephone Encounter (Signed)
Patient was advised to go to the ED or Urgent care. Patient was scheduled an appointment at the Urgent care for 05/02/2022.

## 2022-05-01 NOTE — Telephone Encounter (Signed)
   Used [employer], broadcasting/film/video # [id number]. Chief Complaint: Pt. Currently at [facility] to schedule appointment for chest, rib and back pain from coughing. Symptoms: Cough, pain Frequency: 4 days ago Pertinent Negatives: Patient denies  Disposition: [] ED /[] Urgent Care (no appt availability in office) / [] Appointment(In office/virtual)/ []  [facility]/ [] Home Care/ [] Refused Recommended Disposition /[] [facility]/ []  Follow-up with PCP Additional Notes: Practice made pt. Appointment for tomorrow. Instructed to go to ED for worsening of symptoms.  Reason for Disposition  [1] Chest pain lasts < 5 minutes AND [2] NO chest pain or cardiac symptoms (e.g., breathing difficulty, sweating) now  (Exception: Chest pains that last only a few seconds.)  Answer Assessment - Initial Assessment Questions 1. LOCATION: "Where does it hurt?"       Upper back and middle of chest 2. RADIATION: "Does the pain go anywhere else?" (e.g., into neck, jaw, arms, back)     Back 3. ONSET: "When did the chest pain begin?" (Minutes, hours or days)      4 days ago 4. PATTERN: "Does the pain come and go, or has it been constant since it started?"  "Does it get worse with exertion?"      Pain comes and goes 5. DURATION: "How long does it last" (e.g., seconds, minutes, hours)     Minutes 6. SEVERITY: "How bad is the pain?"  (e.g., Scale 1-10; mild, moderate, or severe)    - MILD (1-3): doesn't interfere with normal activities     - MODERATE (4-7): interferes with normal activities or awakens from sleep    - SEVERE (8-10): excruciating pain, unable to do any normal activities       8 7. CARDIAC RISK FACTORS: "Do you have any history of heart problems or risk factors for heart disease?" (e.g., angina, prior heart attack; diabetes, high blood pressure, high cholesterol, smoker, or strong family history of heart disease)     No 8. PULMONARY RISK FACTORS: "Do you have any history of lung disease?"  (e.g., blood  clots in lung, asthma, emphysema, birth control pills)     No 9. CAUSE: "What do you think is causing the chest pain?"     Cough 10. OTHER SYMPTOMS: "Do you have any other symptoms?" (e.g., dizziness, nausea, vomiting, sweating, fever, difficulty breathing, cough)       Cough, SOB 11. PREGNANCY: "Is there any chance you are pregnant?" "When was your last menstrual period?"       No  Protocols used: Chest Pain-A-AH

## 2022-05-02 ENCOUNTER — Ambulatory Visit (HOSPITAL_COMMUNITY): Payer: Self-pay

## 2022-05-02 ENCOUNTER — Ambulatory Visit
Admission: RE | Admit: 2022-05-02 | Discharge: 2022-05-02 | Disposition: A | Discharge status: Home / Self Care | Payer: No Typology Code available for payment source | Source: Ambulatory Visit | Attending: Emergency Medicine | Admitting: Emergency Medicine

## 2022-05-02 ENCOUNTER — Ambulatory Visit (INDEPENDENT_AMBULATORY_CARE_PROVIDER_SITE_OTHER): Payer: Self-pay

## 2022-05-02 VITALS — BP 133/85 | HR 70 | Temp 98.0°F | Resp 16

## 2022-05-02 DIAGNOSIS — R059 Cough, unspecified: Secondary | ICD-10-CM | POA: Insufficient documentation

## 2022-05-02 DIAGNOSIS — Z79899 Other long term (current) drug therapy: Secondary | ICD-10-CM | POA: Insufficient documentation

## 2022-05-02 DIAGNOSIS — B349 Viral infection, unspecified: Secondary | ICD-10-CM

## 2022-05-02 DIAGNOSIS — R519 Headache, unspecified: Secondary | ICD-10-CM | POA: Insufficient documentation

## 2022-05-02 DIAGNOSIS — Z1152 Encounter for screening for COVID-19: Secondary | ICD-10-CM | POA: Insufficient documentation

## 2022-05-02 MED ORDER — IBUPROFEN 800 MG PO TABS
800.0000 mg | ORAL_TABLET | Freq: Once | ORAL | Status: AC
  Administered 2022-05-02: 800 mg via ORAL

## 2022-05-02 MED ORDER — IBUPROFEN 800 MG PO TABS: 800.0000 mg | ORAL_TABLET | Freq: Three times a day (TID) | ORAL | 0 refills | Status: AC | PRN

## 2022-05-02 NOTE — ED Triage Notes (Signed)
The patient c/o sore throat, chills, fever, body aches, and cough that began last Friday.   The patient states at home Covid test was negative on Saturday  Home interventions: ibuprofen

## 2022-05-02 NOTE — ED Provider Notes (Signed)
[facility]    CSN: [id number] Arrival date & time: 05/02/22  1017    HISTORY   Chief Complaint  Patient presents with   URI    n/a - Entered by patient   Chills   Sore Throat   Generalized Body Aches   Cough   HPI Autumn Benton is a pleasant, 41 y.o. female who presents to urgent care today. The patient c/o sore throat, chills, tactile fever, body aches, and nonproductive cough that began 5 days ago.  States cough was worse for the first 2 days of illness, states she feels like she began to have pain in her back and her chest from coughing but this is better now.  States home COVID-19 test performed today after symptoms began and was negative.  Patient states has been taking ibuprofen 400 mg without relief of headache.  Patient has normal vital signs on arrival today.  EMR reviewed, patient tested positive for COVID-19 in January, 2023 and streptococcal pharyngitis in April, 2023, patient is requesting testing for COVID-19 today.  Patient denies nausea, vomiting, diarrhea, loss of taste or smell, nasal congestion, runny nose.  The history is provided by the patient. A language interpreter was used.   Past Medical History:  Diagnosis Date   Arthritis 09/19/2016   Chlamydia    Dyslipidemia (high LDL; low HDL)    Miscarriage    Prediabetes    Patient Active Problem List   Diagnosis Date Noted   Class 1 obesity without serious comorbidity with body mass index (BMI) of 30.0 to 30.9 in adult 11/18/2018   Dyslipidemia (high LDL; low HDL)    Arm mass, left 10/01/2018   Prediabetes 10/30/2017   Arthritis 09/19/2016   History reviewed. No pertinent surgical history. OB History     Gravida  7   Para  6   Term  6   Preterm      AB      Living  6      SAB      IAB      Ectopic      Multiple      Live Births  1          Home Medications    Prior to Admission medications   Medication Sig Start Date End Date Taking? Authorizing Provider   etonogestrel (NEXPLANON) 68 MG IMPL implant 1 each by Subdermal route once. Last placed 04/2017 to be removed 04/2020    [provider]  ibuprofen (ADVIL) 800 MG tablet Take 1 tablet (800 mg total) by mouth every 8 (eight) hours as needed (pain). 09/05/21   Zenia ResidesBanister, Pamela K, MD  Multiple Vitamin (MULTIVITAMIN) tablet Take 1 tablet by mouth daily. 09/26/20   Hoy RegisterNewlin, Enobong, MD    Family History Family History  Problem Relation Age of Onset   Alcohol abuse Father        had kidney and liver disease at one point related to ETOH intake, but patient states those resolved.   Social History Social History   Tobacco Use   Smoking status: Never   Smokeless tobacco: Never  Vaping Use   Vaping Use: Never used  Substance Use Topics   Alcohol use: No   Drug use: Never   Allergies   Patient has no known allergies.  Review of Systems Review of Systems Pertinent findings revealed after performing a 14 point review of systems has been noted in the history of present illness.  Physical Exam Triage Vital  Signs ED Triage Vitals  Enc Vitals Group     BP 03/23/21 0827 (!) 147/82     Pulse Rate 03/23/21 0827 72     Resp 03/23/21 0827 18     Temp 03/23/21 0827 98.3 F (36.8 C)     Temp Source 03/23/21 0827 Oral     SpO2 03/23/21 0827 98 %     Weight --      Height --      Head Circumference --      Peak Flow --      Pain Score 03/23/21 0826 5     Pain Loc --      Pain Edu? --      Excl. in GC? --   No data found.  Updated Vital Signs BP 133/85 (BP Location: Right Arm)   Pulse 70   Temp 98 F (36.7 C) (Oral)   Resp 16   LMP 04/23/2022   SpO2 96%   Physical Exam Vitals and nursing note reviewed.  Constitutional:      General: She is not in acute distress.    Appearance: Normal appearance. She is not ill-appearing.  HENT:     Head: Normocephalic and atraumatic.     Salivary Glands: Right salivary gland is not diffusely enlarged or tender. Left salivary gland is not  diffusely enlarged or tender.     Right Ear: Hearing, tympanic membrane, ear canal and external ear normal. No drainage. No middle ear effusion. There is no impacted cerumen. Tympanic membrane is not erythematous or bulging.     Left Ear: Hearing, tympanic membrane, ear canal and external ear normal. No drainage.  No middle ear effusion. There is no impacted cerumen. Tympanic membrane is not erythematous or bulging.     Nose: Nose normal. No nasal deformity, septal deviation, mucosal edema, congestion or rhinorrhea.     Right Turbinates: Not enlarged, swollen or pale.     Left Turbinates: Not enlarged, swollen or pale.     Right Sinus: No maxillary sinus tenderness or frontal sinus tenderness.     Left Sinus: No maxillary sinus tenderness or frontal sinus tenderness.     Mouth/Throat:     Lips: Pink. No lesions.     Mouth: Mucous membranes are moist. No oral lesions.     Tongue: No lesions. Tongue does not deviate from midline.     Palate: No mass and lesions.     Pharynx: Oropharynx is clear. Uvula midline. No posterior oropharyngeal erythema or uvula swelling.     Tonsils: No tonsillar exudate. 0 on the right. 0 on the left.  Eyes:     General: Lids are normal.        Right eye: No discharge.        Left eye: No discharge.     Extraocular Movements: Extraocular movements intact.     Conjunctiva/sclera: Conjunctivae normal.     Right eye: Right conjunctiva is not injected. No exudate.    Left eye: Left conjunctiva is not injected. No exudate.    Pupils: Pupils are equal, round, and reactive to light.  Neck:     Trachea: Trachea and phonation normal.  Cardiovascular:     Rate and Rhythm: Normal rate and regular rhythm.     Pulses: Normal pulses.     Heart sounds: Normal heart sounds, S1 normal and S2 normal. No murmur heard.    No friction rub. No gallop.  Pulmonary:     Effort: Pulmonary effort is normal.  No tachypnea, bradypnea, accessory muscle usage, prolonged expiration,  respiratory distress or retractions.     Breath sounds: Normal breath sounds and air entry. No stridor, decreased air movement or transmitted upper airway sounds. No decreased breath sounds, wheezing, rhonchi or rales.  Chest:     Chest wall: No tenderness.  Musculoskeletal:        General: Normal range of motion.     Cervical back: Full passive range of motion without pain, normal range of motion and neck supple. Normal range of motion.  Lymphadenopathy:     Cervical: No cervical adenopathy.  Skin:    General: Skin is warm and dry.     Findings: No erythema or rash.  Neurological:     General: No focal deficit present.     Mental Status: She is alert and oriented to person, place, and time.  Psychiatric:        Mood and Affect: Mood normal.        Behavior: Behavior normal.     Visual Acuity Right Eye Distance:   Left Eye Distance:   Bilateral Distance:    Right Eye Near:   Left Eye Near:    Bilateral Near:     UC Couse / Diagnostics / Procedures:     Radiology DG Chest 2 View  Result Date: 05/02/2022 CLINICAL DATA:  Cough and upper respiratory infection. EXAM: CHEST - 2 VIEW COMPARISON:  Chest radiograph dated June 24, 2021 FINDINGS: The heart size and mediastinal contours are within normal limits. Both lungs are clear. The visualized skeletal structures are unremarkable. IMPRESSION: No active cardiopulmonary disease. Electronically Signed   By: Larose Hires D.O.   On: 05/02/2022 11:28    Procedures Procedures (including critical care time) EKG  Pending results:  Labs Reviewed  SARS CORONAVIRUS 2 (TAT 6-24 HRS)    Medications Ordered in UC: Medications  ibuprofen (ADVIL) tablet 800 mg (800 mg Oral Given 05/02/22 1100)    UC Diagnoses / Final Clinical Impressions(s)   I have reviewed the triage vital signs and the nursing notes.  Pertinent labs & imaging results that were available during my care of the patient were reviewed by me and considered in my medical  decision making (see chart for details).    Final diagnoses:  Viral illness  Acute nonintractable headache, unspecified headache type   Chest x-ray is normal.  COVID-19 testing performed at patient request, patient would not benefit from antiviral treatment positive due to duration of symptoms.  Patient provided with ibuprofen 800 mg for headache and a prescription for the same for continued headache relief should it recur.  Cough is largely resolved at this time, no further intervention is needed.  ED Prescriptions     Medication Sig Dispense Auth. Provider   ibuprofen (ADVIL) 800 MG tablet Take 1 tablet (800 mg total) by mouth every 8 (eight) hours as needed for up to 14 days. 42 tablet Theadora Rama Scales, PA-C      PDMP not reviewed this encounter.  Disposition Upon Discharge:  Condition: stable for discharge home Home: take medications as prescribed; routine discharge instructions as discussed; follow up as advised.  Patient presented with an acute illness with associated systemic symptoms and significant discomfort requiring urgent management. In my opinion, this is a condition that a prudent lay person (someone who possesses an average knowledge of health and medicine) may potentially expect to result in complications if not addressed urgently such as respiratory distress, impairment of bodily function or dysfunction  of bodily organs.   Routine symptom specific, illness specific and/or disease specific instructions were discussed with the patient and/or caregiver at length.   As such, the patient has been evaluated and assessed, work-up was performed and treatment was provided in alignment with urgent care protocols and evidence based medicine.  Patient/parent/caregiver has been advised that the patient may require follow up for further testing and treatment if the symptoms continue in spite of treatment, as clinically indicated and appropriate.  If the patient was tested for  COVID-19, Influenza and/or RSV, then the patient/parent/guardian was advised to isolate at home pending the results of his/her diagnostic coronavirus test and potentially longer if they're positive. I have also advised pt that if his/her COVID-19 test returns positive, it's recommended to self-isolate for at least 10 days after symptoms first appeared AND until fever-free for 24 hours without fever reducer AND other symptoms have improved or resolved. Discussed self-isolation recommendations as well as instructions for household member/close contacts as per the [facility] and [place] DHHS, and also gave patient the COVID packet with this information.  Patient/parent/caregiver has been advised to return to the [facility] or PCP in 3-5 days if no better; to PCP or the Emergency Department if new signs and symptoms develop, or if the current signs or symptoms continue to change or worsen for further workup, evaluation and treatment as clinically indicated and appropriate  The patient will follow up with their current PCP if and as advised. If the patient does not currently have a PCP we will assist them in obtaining one.   The patient may need specialty follow up if the symptoms continue, in spite of conservative treatment and management, for further workup, evaluation, consultation and treatment as clinically indicated and appropriate.  Patient/parent/caregiver verbalized understanding and agreement of plan as discussed.  All questions were addressed during visit.  Please see discharge instructions below for further details of plan.  Discharge Instructions:   Discharge Instructions      Su radiografa de trax fue normal hoy. No hay preocupacin por la neumona o la bronquitis.  Recibiste una prueba PCR de COVID-19 hoy. El resultado de su prueba de COVID-19 se publicar en su MyChart una vez que est completo; por lo general, esto demora de 6 a 12 horas.    Si su prueba PCR de COVID-19 es positiva, se comunicar con  usted por telfono. Debido a que no tiene antecedentes de estar inmunocomprometido, actualmente est vacunado contra el COVID-19, tiene menos de 65 aos, no tiene riesgo de enfermedad grave debido al COVID-19, el tratamiento antiviral no est indicado.  Le proporcion una receta de ibuprofeno de 800 mg que puede tomar 3 veces al da segn sea necesario para el dolor de [place]. Contine con los preparados de venta libre para la tos y el resfriado que ha estado tomando; parecen estar funcionando bien.  Gracias por visitar la atencin de urgencia hoy.  Your chest x-ray was normal today.  There is no concern for pneumonia or bronchitis.  You received a COVID-19 PCR test today.  The result of your COVID-19 test will be posted to your MyChart once it is complete, typically this takes 6 to 12 hours.      If your COVID-19 PCR test is positive, you will be contacted by phone.  Because you do not have a history of being immune compromised, you are currently vaccinated for COVID-19, you are under the age of 63, you do not have a risk of severe disease due  to COVID-19, antiviral treatment is not indicated.  I provided you with a prescription for ibuprofen 800 mg that you can take 3 times daily as needed for headache.  Please continue over-the-counter cough and cold preparations as you have been taking, they appear to be working well.  Thank you for visiting urgent care today.            This office note has been dictated using Teaching laboratory technician.  Unfortunately, this method of dictation can sometimes lead to typographical or grammatical errors.  I apologize for your inconvenience in advance if this occurs.  Please do not hesitate to reach out to me if clarification is needed.      Theadora Rama Scales, PA-C 05/02/22 1140

## 2022-05-02 NOTE — Discharge Instructions (Addendum)
Su radiografa de trax fue normal hoy. No hay preocupacin por la neumona o la bronquitis.  Recibiste una prueba PCR de COVID-19 hoy. El resultado de su prueba de COVID-19 se publicar en su MyChart una vez que est completo; por lo general, esto demora de 6 a 12 horas.    Si su prueba PCR de COVID-19 es positiva, se comunicar con usted por telfono. Debido a que no tiene antecedentes de estar inmunocomprometido, actualmente est vacunado contra el COVID-19, tiene menos de 65 aos, no tiene riesgo de enfermedad grave debido al COVID-19, el tratamiento antiviral no est indicado.  Le proporcion una receta de ibuprofeno de 800 mg que puede tomar 3 veces al da segn sea necesario para el dolor de [place]. Contine con los preparados de venta libre para la tos y el resfriado que ha estado tomando; parecen estar funcionando bien.  Gracias por visitar la atencin de urgencia hoy.  Your chest x-ray was normal today.  There is no concern for pneumonia or bronchitis.  You received a COVID-19 PCR test today.  The result of your COVID-19 test will be posted to your MyChart once it is complete, typically this takes 6 to 12 hours.      If your COVID-19 PCR test is positive, you will be contacted by phone.  Because you do not have a history of being immune compromised, you are currently vaccinated for COVID-19, you are under the age of 75, you do not have a risk of severe disease due to COVID-19, antiviral treatment is not indicated.  I provided you with a prescription for ibuprofen 800 mg that you can take 3 times daily as needed for headache.  Please continue over-the-counter cough and cold preparations as you have been taking, they appear to be working well.  Thank you for visiting urgent care today.

## 2022-05-03 LAB — SARS CORONAVIRUS 2 (TAT 6-24 HRS): SARS Coronavirus 2: NEGATIVE

## 2022-05-23 ENCOUNTER — Ambulatory Visit (HOSPITAL_COMMUNITY)
Admission: EM | Admit: 2022-05-23 | Discharge: 2022-05-23 | Disposition: A | Discharge status: Home / Self Care | Payer: No Typology Code available for payment source | Attending: Internal Medicine | Admitting: Internal Medicine

## 2022-05-23 DIAGNOSIS — R519 Headache, unspecified: Secondary | ICD-10-CM

## 2022-05-23 MED ORDER — KETOROLAC TROMETHAMINE 30 MG/ML IJ SOLN
30.0000 mg | Freq: Once | INTRAMUSCULAR | Status: AC
  Administered 2022-05-23: 30 mg via INTRAMUSCULAR

## 2022-05-23 MED ORDER — ACETAMINOPHEN 325 MG PO TABS
975.0000 mg | ORAL_TABLET | Freq: Once | ORAL | Status: AC
  Administered 2022-05-23: 975 mg via ORAL

## 2022-05-23 MED ORDER — ACETAMINOPHEN 325 MG PO TABS
ORAL_TABLET | ORAL | Status: AC
  Filled 2022-05-23: qty 3

## 2022-05-23 MED ORDER — KETOROLAC TROMETHAMINE 30 MG/ML IJ SOLN
INTRAMUSCULAR | Status: AC
  Filled 2022-05-23: qty 1

## 2022-05-23 NOTE — Discharge Instructions (Signed)
Your headache is likely due to sleep deprivation, stress, and generalized tension.  I gave you an injection of ketorolac in the clinic which is a stronger version of ibuprofen as well as Tylenol.  You may start taking ibuprofen for your headache starting tomorrow by taking 600 mg of ibuprofen every 6 hours as needed for head pain.   Today, I would like for you to go home, go to sleep, and relax/drink plenty of water to stay well-hydrated.  If you develop any new or worsening symptoms or do not improve in the next 2 to 3 days, please return.  If your symptoms are severe, please go to the emergency room.  Follow-up with your primary care provider for further evaluation and management of your symptoms as well as ongoing wellness visits.  I hope you feel better!

## 2022-05-23 NOTE — ED Provider Notes (Incomplete)
[facility]    CSN: [id number] Arrival date & time: 05/23/22  6222      History   Chief Complaint Chief Complaint  Patient presents with   Sinusitis   Headache    HPI Autumn Benton is a 41 y.o. female.   Patient presents to urgent care for evaluation of right frontal/lateral headache starting yesterday.   Right-sided headache to the frontal and lateral aspect of the head starting yesterday Lots of stress recently Works overnight Hasn't taken medicine for it No history of migraines No phonophobia, yes phonophobia Stable neuro exam, everything negative No URI symptoms No neck pain or fever   A language interpreter was used Medical laboratory scientific officer interpreter).  Sinusitis Associated symptoms: headaches   Headache   Past Medical History:  Diagnosis Date   Arthritis 09/19/2016   Chlamydia    Dyslipidemia (high LDL; low HDL)    Miscarriage    Prediabetes     Patient Active Problem List   Diagnosis Date Noted   Class 1 obesity without serious comorbidity with body mass index (BMI) of 30.0 to 30.9 in adult 11/18/2018   Dyslipidemia (high LDL; low HDL)    Arm mass, left 10/01/2018   Prediabetes 10/30/2017   Arthritis 09/19/2016    No past surgical history on file.  OB History     Gravida  7   Para  6   Term  6   Preterm      AB      Living  6      SAB      IAB      Ectopic      Multiple      Live Births  1            Home Medications    Prior to Admission medications   Medication Sig Start Date End Date Taking? Authorizing Provider  Multiple Vitamin (MULTIVITAMIN) tablet Take 1 tablet by mouth daily. 09/26/20   Hoy Register, MD    Family History Family History  Problem Relation Age of Onset   Alcohol abuse Father        had kidney and liver disease at one point related to ETOH intake, but patient states those resolved.    Social History Social History   Tobacco Use   Smoking status: Never   Smokeless  tobacco: Never  Vaping Use   Vaping Use: Never used  Substance Use Topics   Alcohol use: No   Drug use: Never     Allergies   Patient has no known allergies.   Review of Systems Review of Systems  Neurological:  Positive for headaches.     Physical Exam Triage Vital Signs ED Triage Vitals [05/23/22 0824]  Enc Vitals Group     BP 125/78     Pulse Rate 75     Resp 18     Temp 98.7 F (37.1 C)     Temp Source Oral     SpO2 98 %     Weight      Height      Head Circumference      Peak Flow      Pain Score      Pain Loc      Pain Edu?      Excl. in GC?    No data found.  Updated Vital Signs BP 125/78 (BP Location: Left Arm)   Pulse 75   Temp 98.7 F (37.1 C) (Oral)   Resp  18   LMP 04/23/2022   SpO2 98%   Visual Acuity Right Eye Distance:   Left Eye Distance:   Bilateral Distance:    Right Eye Near:   Left Eye Near:    Bilateral Near:     Physical Exam   UC Treatments / Results  Labs (all labs ordered are listed, but only abnormal results are displayed) Labs Reviewed - No data to display  EKG   Radiology No results found.  Procedures Procedures (including critical care time)  Medications Ordered in UC Medications  acetaminophen (TYLENOL) tablet 975 mg (975 mg Oral Given 05/23/22 0908)  ketorolac (TORADOL) 30 MG/ML injection 30 mg (30 mg Intramuscular Given 05/23/22 0909)    Initial Impression / Assessment and Plan / UC Course  I have reviewed the triage vital signs and the nursing notes.  Pertinent labs & imaging results that were available during my care of the patient were reviewed by me and considered in my medical decision making (see chart for details).     *** Final Clinical Impressions(s) / UC Diagnoses   Final diagnoses:  Bad headache     Discharge Instructions      Your headache is likely due to sleep deprivation, stress, and generalized tension.  I gave you an injection of ketorolac in the clinic which is a  stronger version of ibuprofen as well as Tylenol.  You may start taking ibuprofen for your headache starting tomorrow by taking 600 mg of ibuprofen every 6 hours as needed for head pain.   Today, I would like for you to go home, go to sleep, and relax/drink plenty of water to stay well-hydrated.  If you develop any new or worsening symptoms or do not improve in the next 2 to 3 days, please return.  If your symptoms are severe, please go to the emergency room.  Follow-up with your primary care provider for further evaluation and management of your symptoms as well as ongoing wellness visits.  I hope you feel better!   ED Prescriptions   None    PDMP not reviewed this encounter.

## 2022-05-23 NOTE — ED Triage Notes (Signed)
Patient c/o sinus pressure and pain. Pt reports left-sided headache.

## 2022-09-07 ENCOUNTER — Encounter (HOSPITAL_COMMUNITY): Payer: Self-pay | Admitting: Emergency Medicine

## 2022-09-07 ENCOUNTER — Emergency Department (HOSPITAL_COMMUNITY)
Admission: EM | Admit: 2022-09-07 | Discharge: 2022-09-07 | Disposition: A | Discharge status: Home / Self Care | Payer: No Typology Code available for payment source | Attending: Emergency Medicine | Admitting: Emergency Medicine

## 2022-09-07 ENCOUNTER — Other Ambulatory Visit: Payer: Self-pay

## 2022-09-07 ENCOUNTER — Emergency Department (HOSPITAL_COMMUNITY): Payer: No Typology Code available for payment source

## 2022-09-07 DIAGNOSIS — B349 Viral infection, unspecified: Secondary | ICD-10-CM

## 2022-09-07 DIAGNOSIS — R739 Hyperglycemia, unspecified: Secondary | ICD-10-CM | POA: Insufficient documentation

## 2022-09-07 DIAGNOSIS — Z1152 Encounter for screening for COVID-19: Secondary | ICD-10-CM | POA: Insufficient documentation

## 2022-09-07 DIAGNOSIS — R7309 Other abnormal glucose: Secondary | ICD-10-CM

## 2022-09-07 DIAGNOSIS — F419 Anxiety disorder, unspecified: Secondary | ICD-10-CM

## 2022-09-07 DIAGNOSIS — E876 Hypokalemia: Secondary | ICD-10-CM | POA: Insufficient documentation

## 2022-09-07 HISTORY — DX: Anxiety disorder, unspecified: F41.9

## 2022-09-07 LAB — CBC WITH DIFFERENTIAL/PLATELET
Abs Immature Granulocytes: 0.02 10*3/uL (ref 0.00–0.07)
Basophils Absolute: 0.1 10*3/uL (ref 0.0–0.1)
Basophils Relative: 1 %
Eosinophils Absolute: 0.3 10*3/uL (ref 0.0–0.5)
Eosinophils Relative: 3 %
HCT: 38.4 % (ref 36.0–46.0)
Hemoglobin: 13.4 g/dL (ref 12.0–15.0)
Immature Granulocytes: 0 %
Lymphocytes Relative: 32 %
Lymphs Abs: 2.4 10*3/uL (ref 0.7–4.0)
MCH: 30.9 pg (ref 26.0–34.0)
MCHC: 34.9 g/dL (ref 30.0–36.0)
MCV: 88.5 fL (ref 80.0–100.0)
Monocytes Absolute: 0.5 10*3/uL (ref 0.1–1.0)
Monocytes Relative: 6 %
Neutro Abs: 4.5 10*3/uL (ref 1.7–7.7)
Neutrophils Relative %: 58 %
Platelets: 228 10*3/uL (ref 150–400)
RBC: 4.34 MIL/uL (ref 3.87–5.11)
RDW: 11.9 % (ref 11.5–15.5)
WBC: 7.7 10*3/uL (ref 4.0–10.5)
nRBC: 0 % (ref 0.0–0.2)

## 2022-09-07 LAB — URINALYSIS, ROUTINE W REFLEX MICROSCOPIC
Bacteria, UA: NONE SEEN
Bilirubin Urine: NEGATIVE
Glucose, UA: NEGATIVE mg/dL
Ketones, ur: NEGATIVE mg/dL
Leukocytes,Ua: NEGATIVE
Nitrite: NEGATIVE
Protein, ur: NEGATIVE mg/dL
Specific Gravity, Urine: 1.023 (ref 1.005–1.030)
pH: 5 (ref 5.0–8.0)

## 2022-09-07 LAB — I-STAT BETA HCG BLOOD, ED (MC, WL, AP ONLY): I-stat hCG, quantitative: <5 m[IU]/mL (ref <5)

## 2022-09-07 LAB — RESP PANEL BY RT-PCR (RSV, FLU A&B, COVID)  RVPGX2
Influenza A by PCR: NEGATIVE
Influenza B by PCR: NEGATIVE
Resp Syncytial Virus by PCR: NEGATIVE
SARS Coronavirus 2 by RT PCR: NEGATIVE

## 2022-09-07 LAB — COMPREHENSIVE METABOLIC PANEL
ALT: 19 U/L (ref 0–44)
AST: 22 U/L (ref 15–41)
Albumin: 3.8 g/dL (ref 3.5–5.0)
Alkaline Phosphatase: 63 U/L (ref 38–126)
Anion gap: 12 (ref 5–15)
BUN: 12 mg/dL (ref 6–20)
CO2: 22 mmol/L (ref 22–32)
Calcium: 9.5 mg/dL (ref 8.9–10.3)
Chloride: 103 mmol/L (ref 98–111)
Creatinine, Ser: 0.77 mg/dL (ref 0.44–1.00)
GFR, Estimated: >60 mL/min (ref >60)
Glucose, Bld: 111 mg/dL — ABNORMAL HIGH (ref 70–99)
Potassium: 3.4 mmol/L — ABNORMAL LOW (ref 3.5–5.1)
Sodium: 137 mmol/L (ref 135–145)
Total Bilirubin: 0.8 mg/dL (ref 0.3–1.2)
Total Protein: 7.2 g/dL (ref 6.5–8.1)

## 2022-09-07 MED ORDER — PROCHLORPERAZINE EDISYLATE 10 MG/2ML IJ SOLN
10.0000 mg | Freq: Once | INTRAMUSCULAR | Status: AC
  Administered 2022-09-07: 10 mg via INTRAVENOUS
  Filled 2022-09-07: qty 2

## 2022-09-07 MED ORDER — POTASSIUM CHLORIDE CRYS ER 20 MEQ PO TBCR
40.0000 meq | EXTENDED_RELEASE_TABLET | Freq: Once | ORAL | Status: AC
  Administered 2022-09-07: 40 meq via ORAL
  Filled 2022-09-07: qty 2

## 2022-09-07 MED ORDER — SODIUM CHLORIDE 0.9 % IV BOLUS
1000.0000 mL | Freq: Once | INTRAVENOUS | Status: AC
  Administered 2022-09-07: 1000 mL via INTRAVENOUS

## 2022-09-07 MED ORDER — KETOROLAC TROMETHAMINE 30 MG/ML IJ SOLN
30.0000 mg | Freq: Once | INTRAMUSCULAR | Status: AC
  Administered 2022-09-07: 30 mg via INTRAVENOUS
  Filled 2022-09-07: qty 1

## 2022-09-07 NOTE — Discharge Instructions (Addendum)
Tome ibuprofeno y/o acetaminofn segn sea necesario para Chief Technology Officer.

## 2022-09-07 NOTE — ED Triage Notes (Signed)
Pt reports headache starting this afternoon as well as tinglinging in whole mouth. Hx of anxiety and states she is under  a lot of stress. No oral swelling to mouth or trouble breathing.

## 2022-09-07 NOTE — ED Provider Notes (Addendum)
[place] EMERGENCY DEPARTMENT AT [facility] Provider Note   CSN: [id number] Arrival date & time: 09/07/22  0154     History  Chief Complaint  Patient presents with   Headache    Autumn Benton is a 42 y.o. female.  The history is provided by the patient. A language interpreter was used.  Headache She has history of prediabetes, hyperlipidemia, anxiety and comes in with multiple complaints.  Over the past week, she initially had left ear pain which radiated to the throat.  She then had a subjective fever without chills or sweats.  She has had a cough productive of some green sputum.  She is complaining of some aching in her back and shoulders as well as a generalized headache.  She denies nausea, vomiting, diarrhea.  She has had sick contacts and that family members had similar illnesses, but they have improved much faster than she has.  She did start taking amoxicillin 3 days ago without any improvement.  She also relates intense anxiety with spontaneous crying spells.  She denies homicidal or suicidal ideation.  She is very concerned about her anxiety and wants to get help with that.   Home Medications Prior to Admission medications   Medication Sig Start Date End Date Taking? Authorizing Provider  Multiple Vitamin (MULTIVITAMIN) tablet Take 1 tablet by mouth daily. 09/26/20   Hoy Register, MD      Allergies    Patient has no known allergies.    Review of Systems   Review of Systems  Neurological:  Positive for headaches.  All other systems reviewed and are negative.   Physical Exam Updated Vital Signs BP (!) 152/92   Pulse 98   Temp 98.1 F (36.7 C) (Oral)   Ht  (1.626 m)   LMP 09/06/2022   SpO2 100%   BMI 29.93 kg/m  Physical Exam Vitals and nursing note reviewed.   42 year old female, resting comfortably and in no acute distress. Vital signs are normal. Oxygen saturation is 99%, which is normal. Head is normocephalic and atraumatic.  PERRLA, EOMI. Oropharynx is clear.  Tympanic membranes are clear. Neck is nontender and supple without adenopathy. Back is nontender and there is no CVA tenderness. Lungs are clear without rales, wheezes, or rhonchi. Chest is nontender. Heart has regular rate and rhythm without murmur. Abdomen is soft, flat, nontender without masses or hepatosplenomegaly and peristalsis is normoactive. Extremities have no cyanosis or edema, full range of motion is present. Skin is warm and dry without rash. Neurologic: Awake and alert, speech is spontaneous and appropriate, cranial nerves are intact, there are no motor or sensory deficits. Psychiatric: Normal affect but noted to have spontaneous crying episodes.  ED Results / Procedures / Treatments   Labs (all labs ordered are listed, but only abnormal results are displayed) Labs Reviewed  COMPREHENSIVE METABOLIC PANEL - Abnormal; Notable for the following components:      Result Value   Potassium 3.4 (*)    Glucose, Bld 111 (*)    All other components within normal limits  URINALYSIS, ROUTINE W REFLEX MICROSCOPIC - Abnormal; Notable for the following components:   Hgb urine dipstick LARGE (*)    All other components within normal limits  CBC WITH DIFFERENTIAL/PLATELET  I-STAT BETA HCG BLOOD, ED ([facility], [facility], [facility] ONLY)   Radiology No results found.  Procedures Procedures    Medications Ordered in ED Medications  sodium chloride 0.9 % bolus 1,000 mL (has no administration in time  range)  ketorolac (TORADOL) 30 MG/ML injection 30 mg (has no administration in time range)  prochlorperazine (COMPAZINE) injection 10 mg (has no administration in time range)  potassium chloride SA (KLOR-CON M) CR tablet 40 mEq (has no administration in time range)    ED Course/ Medical Decision Making/ A&P                             Medical Decision Making Amount and/or Complexity of Data Reviewed Labs: ordered. Radiology: ordered.  Risk Prescription drug  management.   Cough, ear pain, sore throat, headache, body aches and pattern strongly suggestive of viral illness such as influenza or COVID-19.  With purulent sputum, need to rule out pneumonia.  No evidence of otitis media on exam.  I have ordered respiratory pathogen panel, chest x-ray to rule out pneumonia.  I have reviewed and interpreted her laboratory tests and my interpretation is mild hypokalemia, mildly elevated random glucose, normal CBC, normal urinalysis, negative pregnancy test.  I have ordered IV fluids, ketorolac, prochlorperazine, diphenhydramine.  Following above-noted treatment, she is feeling much better.  I am discharging her with instructions to drink plenty of fluids and take over-the-counter NSAIDs and acetaminophen as needed for achiness.  I am referring her to behavioral health urgent care regarding her anxiety, also giving her outpatient mental health resources.  Final Clinical Impression(s) / ED Diagnoses Final diagnoses:  Viral illness  Anxiety  Hypokalemia  Elevated random blood glucose level    Rx / DC Orders ED Discharge Orders     None         Dione Booze, MD 09/07/22 4142    Dione Booze, MD 09/07/22 [phone]

## 2023-07-08 ENCOUNTER — Ambulatory Visit (HOSPITAL_COMMUNITY)
Admission: EM | Admit: 2023-07-08 | Discharge: 2023-07-08 | Disposition: A | Discharge status: Home / Self Care | Payer: No Typology Code available for payment source

## 2023-07-08 ENCOUNTER — Encounter (HOSPITAL_COMMUNITY): Payer: Self-pay | Admitting: *Deleted

## 2023-07-08 DIAGNOSIS — R1084 Generalized abdominal pain: Secondary | ICD-10-CM

## 2023-07-08 DIAGNOSIS — R110 Nausea: Secondary | ICD-10-CM

## 2023-07-08 DIAGNOSIS — K2900 Acute gastritis without bleeding: Secondary | ICD-10-CM

## 2023-07-08 MED ORDER — ONDANSETRON 4 MG PO TBDP: 4.0000 mg | ORAL_TABLET | Freq: Three times a day (TID) | ORAL | 0 refills | Status: AC | PRN

## 2023-07-08 MED ORDER — FAMOTIDINE 20 MG PO TABS: 20.0000 mg | ORAL_TABLET | Freq: Every day | ORAL | 0 refills | Status: AC

## 2023-07-08 NOTE — ED Provider Notes (Signed)
[facility]    CSN: [id number] Arrival date & time: 07/08/23  1040      History   Chief Complaint Chief Complaint  Patient presents with   Headache   Nausea   Abdominal Pain    HPI Autumn Benton is a 43 y.o. female.   HPI  Patient visit was completed with Spanish interpreter services. TranslatorBurman Foster # [id number] She states she is having abdominal pain since Sat  She reports pain is around the center and some epigastric with upward movement She reports she is having some reflux symptoms as well She states she vomited once on Sat- denies bloody emesis or coffee ground appearance She is able to tolerate liquid PO intake without issues  Interventions:  She states 2 of her children also had vomiting for one day   Past Medical History:  Diagnosis Date   Anxiety    Arthritis 09/19/2016   Chlamydia    Dyslipidemia (high LDL; low HDL)    Miscarriage    Prediabetes     Patient Active Problem List   Diagnosis Date Noted   Class 1 obesity without serious comorbidity with body mass index (BMI) of 30.0 to 30.9 in adult 11/18/2018   Dyslipidemia (high LDL; low HDL)    Arm mass, left 10/01/2018   Prediabetes 10/30/2017   Arthritis 09/19/2016    History reviewed. No pertinent surgical history.  OB History     Gravida  7   Para  6   Term  6   Preterm      AB      Living  6      SAB      IAB      Ectopic      Multiple      Live Births  1            Home Medications    Prior to Admission medications   Medication Sig Start Date End Date Taking? Authorizing Provider  famotidine (PEPCID) 20 MG tablet Take 1 tablet (20 mg total) by mouth at bedtime. 07/08/23  Yes Saamir Armstrong E, PA-C  ondansetron (ZOFRAN-ODT) 4 MG disintegrating tablet Take 1 tablet (4 mg total) by mouth every 8 (eight) hours as needed for nausea or vomiting. 07/08/23  Yes Karmen Altamirano E, PA-C  etonogestrel (NEXPLANON) 68 MG IMPL implant 1 each by Subdermal route  once.    [provider]  Multiple Vitamin (MULTIVITAMIN) tablet Take 1 tablet by mouth daily. 09/26/20   Hoy Register, MD    Family History Family History  Problem Relation Age of Onset   Alcohol abuse Father        had kidney and liver disease at one point related to ETOH intake, but patient states those resolved.    Social History Social History   Tobacco Use   Smoking status: Never   Smokeless tobacco: Never  Vaping Use   Vaping status: Never Used  Substance Use Topics   Alcohol use: No   Drug use: Never     Allergies   Patient has no known allergies.   Review of Systems Review of Systems  Constitutional:  Positive for fatigue. Negative for chills and fever.  Respiratory:  Negative for choking, shortness of breath and wheezing.   Gastrointestinal:  Positive for abdominal pain, nausea and vomiting (on sat. nothing since). Negative for blood in stool and diarrhea.       Reflux    Genitourinary:  Negative for dysuria, flank pain,  frequency and hematuria.  Neurological:  Positive for headaches.     Physical Exam Triage Vital Signs ED Triage Vitals  Encounter Vitals Group     BP 07/08/23 1131 129/84     Systolic BP Percentile --      Diastolic BP Percentile --      Pulse Rate 07/08/23 1131 78     Resp 07/08/23 1131 18     Temp 07/08/23 1131 98.7 F (37.1 C)     Temp Source 07/08/23 1131 Oral     SpO2 07/08/23 1131 98 %     Weight --      Height --      Head Circumference --      Peak Flow --      Pain Score 07/08/23 1129 6     Pain Loc --      Pain Education --      Exclude from Growth Chart --    No data found.  Updated Vital Signs BP 129/84 (BP Location: Right Arm)   Pulse 78   Temp 98.7 F (37.1 C) (Oral)   Resp 18   LMP 07/01/2023 (Approximate)   SpO2 98%   Visual Acuity Right Eye Distance:   Left Eye Distance:   Bilateral Distance:    Right Eye Near:   Left Eye Near:    Bilateral Near:     Physical Exam Vitals reviewed.   Constitutional:      General: She is awake.     Appearance: She is well-developed and well-groomed. She is ill-appearing.  HENT:     Head: Normocephalic and atraumatic.     Mouth/Throat:     Mouth: Mucous membranes are moist.     Pharynx: Oropharynx is clear.  Cardiovascular:     Rate and Rhythm: Normal rate and regular rhythm.     Heart sounds: Normal heart sounds. No murmur heard.    No friction rub. No gallop.  Pulmonary:     Effort: Pulmonary effort is normal.     Breath sounds: Normal breath sounds. No decreased air movement. No decreased breath sounds, wheezing, rhonchi or rales.  Abdominal:     General: Abdomen is flat. Bowel sounds are normal.     Palpations: Abdomen is soft.     Tenderness: There is abdominal tenderness in the right lower quadrant, epigastric area, suprapubic area and left lower quadrant.  Musculoskeletal:     Cervical back: Normal range of motion and neck supple.  Skin:    General: Skin is warm and dry.  Neurological:     General: No focal deficit present.     Mental Status: She is alert and oriented to person, place, and time.  Psychiatric:        Attention and Perception: Attention normal.        Mood and Affect: Mood normal.        Speech: Speech normal.        Behavior: Behavior normal. Behavior is cooperative.      UC Treatments / Results  Labs (all labs ordered are listed, but only abnormal results are displayed) Labs Reviewed - No data to display  EKG   Radiology No results found.  Procedures Procedures (including critical care time)  Medications Ordered in UC Medications - No data to display  Initial Impression / Assessment and Plan / UC Course  I have reviewed the triage vital signs and the nursing notes.  Pertinent labs & imaging results that were available during my care  of the patient were reviewed by me and considered in my medical decision making (see chart for details).      Final Clinical Impressions(s) / UC  Diagnoses   Final diagnoses:  Other acute gastritis, presence of bleeding unspecified  Nausea  Generalized abdominal pain   Acute, new concern Patient presents today with concerns for generalized abdominal pain, nausea, vomiting once on Saturday.  She has not taken anything for intervention.  At this time I suspect likely viral gastritis and reflux.  Will send in Zofran p.o. 3 times daily as needed to assist with nausea.  Reviewed that she should increase liquid p.o. intake to prevent dehydration.  Also reviewed that she can take Pepto or Tums as needed for acute reflux.  Will send in Pepcid 20 mg p.o. nightly to assist with reflux prevention.  Recommend that she takes this for at least 2 weeks to fully calm down with GERD symptoms.  ED and return precautions reviewed and provided in after visit summary.  Follow-up as needed for progressing or persistent symptoms     Discharge Instructions      At this time I suspect you likely have a viral gastritis.  Usually we manage this with conservative measures such as increasing hydration status, managing your nausea and vomiting.  I have sent in a medication called Zofran for you to take as needed up to every 8 hours for your nausea and vomiting.  One of the side effects of this medication is constipation so please make sure that you are using a stool softener or increasing your fiber to accommodate this.  I have also sent in a medication called Pepcid to help with acid reflux.  You can take this by mouth at bedtime.  I recommend taking this for at least 2 weeks to help calm down your acid reflux.  If your abdominal pain becomes worse, you develop fever, changes to your bowel habits, uncontrollable nausea and vomiting, dehydration please go to the emergency room for further evaluation and management.  Please follow-up with your PCP (primary care provider, family medicine office) to discuss completing your Pap smear.     ED Prescriptions      Medication Sig Dispense Auth. Provider   ondansetron (ZOFRAN-ODT) 4 MG disintegrating tablet Take 1 tablet (4 mg total) by mouth every 8 (eight) hours as needed for nausea or vomiting. 20 tablet Gurnie Duris E, PA-C   famotidine (PEPCID) 20 MG tablet Take 1 tablet (20 mg total) by mouth at bedtime. 30 tablet Yaresly Menzel E, PA-C      PDMP not reviewed this encounter.   Khang Hannum, Oswaldo Conroy, PA-C 07/08/23 1221

## 2023-07-08 NOTE — ED Triage Notes (Signed)
Professional interpretor used for clinical intake.   Pt states she has had abdominal pain, nausea, vomiting on Saturday. Since Saturday she hasn't vomited only nausea, headache, waist pain.  She has only used home remedies like tea no meds for sx.

## 2023-07-08 NOTE — Discharge Instructions (Addendum)
At this time I suspect you likely have a viral gastritis.  Usually we manage this with conservative measures such as increasing hydration status, managing your nausea and vomiting.  I have sent in a medication called Zofran for you to take as needed up to every 8 hours for your nausea and vomiting.  One of the side effects of this medication is constipation so please make sure that you are using a stool softener or increasing your fiber to accommodate this.  I have also sent in a medication called Pepcid to help with acid reflux.  You can take this by mouth at bedtime.  I recommend taking this for at least 2 weeks to help calm down your acid reflux.  If your abdominal pain becomes worse, you develop fever, changes to your bowel habits, uncontrollable nausea and vomiting, dehydration please go to the emergency room for further evaluation and management.  Please follow-up with your PCP (primary care provider, family medicine office) to discuss completing your Pap smear.

## 2023-10-08 ENCOUNTER — Inpatient Hospital Stay (HOSPITAL_COMMUNITY)

## 2023-10-08 ENCOUNTER — Inpatient Hospital Stay (HOSPITAL_COMMUNITY)
Admission: AD | Admit: 2023-10-08 | Discharge: 2023-10-08 | Disposition: A | Discharge status: Home / Self Care | Attending: Obstetrics and Gynecology | Admitting: Obstetrics and Gynecology

## 2023-10-08 ENCOUNTER — Other Ambulatory Visit: Payer: Self-pay

## 2023-10-08 ENCOUNTER — Encounter (HOSPITAL_COMMUNITY): Payer: Self-pay | Admitting: Obstetrics and Gynecology

## 2023-10-08 DIAGNOSIS — O3411 Maternal care for benign tumor of corpus uteri, first trimester: Secondary | ICD-10-CM | POA: Insufficient documentation

## 2023-10-08 DIAGNOSIS — D251 Intramural leiomyoma of uterus: Secondary | ICD-10-CM | POA: Insufficient documentation

## 2023-10-08 DIAGNOSIS — Z5986 Financial insecurity: Secondary | ICD-10-CM | POA: Insufficient documentation

## 2023-10-08 DIAGNOSIS — R109 Unspecified abdominal pain: Secondary | ICD-10-CM | POA: Insufficient documentation

## 2023-10-08 DIAGNOSIS — O209 Hemorrhage in early pregnancy, unspecified: Secondary | ICD-10-CM

## 2023-10-08 DIAGNOSIS — O0941 Supervision of pregnancy with grand multiparity, first trimester: Secondary | ICD-10-CM | POA: Insufficient documentation

## 2023-10-08 DIAGNOSIS — O26841 Uterine size-date discrepancy, first trimester: Secondary | ICD-10-CM | POA: Insufficient documentation

## 2023-10-08 DIAGNOSIS — Z3A10 10 weeks gestation of pregnancy: Secondary | ICD-10-CM

## 2023-10-08 DIAGNOSIS — K5900 Constipation, unspecified: Secondary | ICD-10-CM | POA: Insufficient documentation

## 2023-10-08 DIAGNOSIS — O99611 Diseases of the digestive system complicating pregnancy, first trimester: Secondary | ICD-10-CM | POA: Insufficient documentation

## 2023-10-08 DIAGNOSIS — D250 Submucous leiomyoma of uterus: Secondary | ICD-10-CM | POA: Insufficient documentation

## 2023-10-08 DIAGNOSIS — O26891 Other specified pregnancy related conditions, first trimester: Secondary | ICD-10-CM

## 2023-10-08 LAB — URINALYSIS, ROUTINE W REFLEX MICROSCOPIC
Bilirubin Urine: NEGATIVE
Glucose, UA: NEGATIVE mg/dL
Hgb urine dipstick: NEGATIVE
Ketones, ur: NEGATIVE mg/dL
Leukocytes,Ua: NEGATIVE
Nitrite: NEGATIVE
Protein, ur: NEGATIVE mg/dL
Specific Gravity, Urine: 1.017 (ref 1.005–1.030)
pH: 5 (ref 5.0–8.0)

## 2023-10-08 LAB — CBC
HCT: 42.3 % (ref 36.0–46.0)
Hemoglobin: 14.7 g/dL (ref 12.0–15.0)
MCH: 30.8 pg (ref 26.0–34.0)
MCHC: 34.8 g/dL (ref 30.0–36.0)
MCV: 88.7 fL (ref 80.0–100.0)
Platelets: 229 10*3/uL (ref 150–400)
RBC: 4.77 MIL/uL (ref 3.87–5.11)
RDW: 13.1 % (ref 11.5–15.5)
WBC: 7.3 10*3/uL (ref 4.0–10.5)
nRBC: 0 % (ref 0.0–0.2)

## 2023-10-08 LAB — WET PREP, GENITAL
Clue Cells Wet Prep HPF POC: NONE SEEN
Sperm: NONE SEEN
Trich, Wet Prep: NONE SEEN
WBC, Wet Prep HPF POC: <10 (ref <10)
Yeast Wet Prep HPF POC: NONE SEEN

## 2023-10-08 LAB — HCG, QUANTITATIVE, PREGNANCY: hCG, Beta Chain, Quant, S: 2999 m[IU]/mL — ABNORMAL HIGH (ref <5)

## 2023-10-08 LAB — POCT PREGNANCY, URINE: Preg Test, Ur: POSITIVE — AB

## 2023-10-08 MED ORDER — ACETAMINOPHEN 500 MG PO TABS: 1000.0000 mg | ORAL_TABLET | Freq: Four times a day (QID) | ORAL | Status: DC | PRN

## 2023-10-08 MED ORDER — TRAMADOL HCL 50 MG PO TABS: 50.0000 mg | ORAL_TABLET | Freq: Four times a day (QID) | ORAL | 0 refills | Status: DC | PRN

## 2023-10-08 MED ORDER — POLYETHYLENE GLYCOL 3350 17 GM/SCOOP PO POWD: 17.0000 g | Freq: Every day | ORAL | 2 refills | Status: AC | PRN

## 2023-10-08 MED ORDER — ACETAMINOPHEN 500 MG PO TABS
1000.0000 mg | ORAL_TABLET | Freq: Four times a day (QID) | ORAL | Status: DC | PRN
  Administered 2023-10-08: 1000 mg via ORAL
  Filled 2023-10-08: qty 2

## 2023-10-08 NOTE — MAU Note (Signed)
 Autumn Benton is a 43 y.o. at Unknown here in MAU reporting: she's two months pregnant and began having abdominal pain yesterday and noted spotting with wiping today.  Reports the pain is constant and feels like contractions.  LMP: 07/26/2023 Onset of complaint: yesterday Pain score: 10 Vitals:   10/08/23 0804  BP: 118/73  Pulse: 75  Resp: 19  Temp: 98.6 F (37 C)  SpO2: 100%     FHT: NA  Lab orders placed from triage: UPT

## 2023-10-08 NOTE — MAU Provider Note (Signed)
 Chief Complaint: Abdominal Pain and Vaginal Bleeding   Event Date/Time   First Provider Initiated Contact with Patient 10/08/23 0924     SUBJECTIVE HPI: Autumn Benton is a 43 y.o. [id number] at [redacted]w[redacted]d who presents to Maternity Admissions reporting moderate cramping since yesterday and light vaginal bleeding since this morning.   Passage of tissue or clots: denies Dizziness: denies  O POS  Pain Location: low back radiating around to low abd Quality: cramping Severity: 9/10 on pain scale Duration: 24 hours Context: Early pregnancy, IUP not verified  Modifying factors: There are no aggravating or aleviating factors. Hasn't tried anything  Associated signs and symptoms: Pos for chills, urinary frequency, urine feeling "hot", constipation. Neg for fever. Last BM this morning. Feels like she has to have BM but nothing comes out.    Past Medical History:  Diagnosis Date   Anxiety    Arthritis 09/19/2016   Chlamydia    Dyslipidemia (high LDL; low HDL)    Miscarriage    Prediabetes    OB History  Gravida Para Term Preterm AB Living  8 6 6   6   SAB IAB Ectopic Multiple Live Births      1    # Outcome Date GA Lbr Len/2nd Weight Sex Type Anes PTL Lv  8 Current           7 Term 04/07/13 [redacted]w[redacted]d / 00:08 2280 g M Vag-Spont EPI  LIV     Birth Comments: none  6 Gravida              Birth Comments: System Generated. Please review and update pregnancy details.  5 Term           4 Term           3 Term           2 Term           1 Term            No past surgical history on file. Social History   Socioeconomic History   Marital status: Single    Spouse name: Anthonette Kinsman   Number of children: 6   Years of education: 6   Highest education level: High school graduate  Occupational History   Occupation: [employer]  Tobacco Use   Smoking status: Never   Smokeless tobacco: Never  Vaping Use   Vaping status: Never Used  Substance and Sexual Activity   Alcohol use:  No   Drug use: Never   Sexual activity: Yes    Birth control/protection: Implant, None  Other Topics Concern   Not on file  Social History Narrative      Patient just back to work with limited hours 10/22/2018 due to pandemic   Her husband has been out of work as well.   Having difficulties paying for food and rent currently.   Lives at home with husband and 3 youngest children.   Social Drivers of Health   Financial Resource Strain: High Risk (10/22/2018)   Overall Financial Resource Strain (CARDIA)    Difficulty of Paying Living Expenses: Hard  Food Insecurity: Food Insecurity Present (12/07/2020)   Hunger Vital Sign    Worried About Running Out of Food in the Last Year: Sometimes true    Ran Out of Food in the Last Year: Sometimes true  Transportation Needs: No Transportation Needs (12/07/2020)   PRAPARE - Administrator, Civil Service (Medical): No    Lack  of Transportation (Non-Medical): No  Physical Activity: Inactive (10/22/2018)   Exercise Vital Sign    Days of Exercise per Week: 0 days    Minutes of Exercise per Session: 0 min  Stress: Stress Concern Present (10/22/2018)   [employer] of Occupational Health - Occupational Stress Questionnaire    Feeling of Stress : Very much  Social Connections: Somewhat Isolated (10/22/2018)   Social Connection and Isolation Panel [NHANES]    Frequency of Communication with Friends and Family: More than three times a week    Frequency of Social Gatherings with Friends and Family: Never    Attends Religious Services: Never    Database administrator or Organizations: No    Attends Banker Meetings: Never    Marital Status: Married  Catering manager Violence: Not At Risk (10/22/2018)   Humiliation, Afraid, Rape, and Kick questionnaire    Fear of Current or Ex-Partner: No    Emotionally Abused: No    Physically Abused: No    Sexually Abused: No   No current facility-administered medications on file prior to  encounter.   Current Outpatient Medications on File Prior to Encounter  Medication Sig Dispense Refill   etonogestrel (NEXPLANON) 68 MG IMPL implant 1 each by Subdermal route once.     famotidine  (PEPCID ) 20 MG tablet Take 1 tablet (20 mg total) by mouth at bedtime. 30 tablet 0   Multiple Vitamin (MULTIVITAMIN) tablet Take 1 tablet by mouth daily. 30 tablet 3   ondansetron  (ZOFRAN -ODT) 4 MG disintegrating tablet Take 1 tablet (4 mg total) by mouth every 8 (eight) hours as needed for nausea or vomiting. 20 tablet 0   No Known Allergies  I have reviewed the past Medical Hx, Surgical Hx, Social Hx, Allergies and Medications.   Review of Systems  Constitutional:  Positive for chills. Negative for fever.  Gastrointestinal:  Positive for abdominal pain and constipation. Negative for diarrhea, nausea and vomiting.  Genitourinary:  Positive for vaginal bleeding. Negative for dysuria, frequency, hematuria and urgency.    OBJECTIVE Patient Vitals for the past 24 hrs:  BP Temp Temp src Pulse Resp SpO2 Weight  10/08/23 0804 118/73 98.6 F (37 C) Oral 75 19 100 % 82.4 kg   Constitutional: Well-developed, well-nourished female in no acute distress.  Cardiovascular: normal rate Respiratory: normal rate and effort.  GI: Abd soft, mild generalized tender.  MS: Extremities nontender, no edema, normal ROM Neurologic: Alert and oriented x 4.  GU: Neg CVAT.  SPECULUM EXAM: NEFG, physiologic discharge, scant dark blood noted coming through os, cervix clean, non-friable.   BIMANUAL: cervix clean, closed; uterus normal size, no adnexal tenderness or masses.  Mild CMT.  LAB RESULTS Results for orders placed or performed during the hospital encounter of 10/08/23 (from the past 24 hours)  Pregnancy, urine POC     Status: Abnormal   Collection Time: 10/08/23  8:15 AM  Result Value Ref Range   Preg Test, Ur POSITIVE (A) NEGATIVE  Urinalysis, Routine w reflex microscopic -Urine, Clean Catch     Status:  None   Collection Time: 10/08/23  8:17 AM  Result Value Ref Range   Color, Urine YELLOW YELLOW   APPearance CLEAR CLEAR   Specific Gravity, Urine 1.017 1.005 - 1.030   pH 5.0 5.0 - 8.0   Glucose, UA NEGATIVE NEGATIVE mg/dL   Hgb urine dipstick NEGATIVE NEGATIVE   Bilirubin Urine NEGATIVE NEGATIVE   Ketones, ur NEGATIVE NEGATIVE mg/dL   Protein, ur NEGATIVE NEGATIVE mg/dL  Nitrite NEGATIVE NEGATIVE   Leukocytes,Ua NEGATIVE NEGATIVE  CBC     Status: None   Collection Time: 10/08/23  8:33 AM  Result Value Ref Range   WBC 7.3 4.0 - 10.5 K/uL   RBC 4.77 3.87 - 5.11 MIL/uL   Hemoglobin 14.7 12.0 - 15.0 g/dL   HCT 16.1 09.6 - 04.5 %   MCV 88.7 80.0 - 100.0 fL   MCH 30.8 26.0 - 34.0 pg   MCHC 34.8 30.0 - 36.0 g/dL   RDW 40.9 81.1 - 91.4 %   Platelets 229 150 - 400 K/uL   nRBC 0.0 0.0 - 0.2 %  Wet prep, genital     Status: None   Collection Time: 10/08/23  8:35 AM   Specimen: PATH Cytology Cervicovaginal Ancillary Only  Result Value Ref Range   Yeast Wet Prep HPF POC NONE SEEN NONE SEEN   Trich, Wet Prep NONE SEEN NONE SEEN   Clue Cells Wet Prep HPF POC NONE SEEN NONE SEEN   WBC, Wet Prep HPF POC <10 <10   Sperm NONE SEEN     IMAGING US  OB LESS THAN 14 WEEKS WITH OB TRANSVAGINAL Result Date: 10/08/2023 CLINICAL DATA:  [id number] Vaginal bleeding in pregnancy, first trimester [id number] Abdominal pain during pregnancy in first trimester 1470026. EXAM: OBSTETRIC <14 WK US  AND TRANSVAGINAL OB US  TECHNIQUE: Both transabdominal and transvaginal ultrasound examinations were performed for complete evaluation of the gestation as well as the maternal uterus, adnexal regions, and pelvic cul-de-sac. Transvaginal technique was performed to assess early pregnancy. COMPARISON:  None Available. FINDINGS: Intrauterine gestational sac: Single Yolk sac:  Not Visualized. Embryo:  Not Visualized. MSD: 17.9 mm   6 w   5 d Subchorionic hemorrhage:  None visualized. Maternal uterus/adnexae: Normal-size  anteverted uterus. Note is made of an approximately 1.9 x 2.2 x 2.6 cm anterior uterine body intramural leiomyoma with 25-50% submucosal component. Bilateral ovaries are visualized and appear within normal limits. IMPRESSION: *Single intrauterine gestational sac with mean sac diameter corresponding to 6 weeks 5 days. *There is a 2.6 cm anterior uterine body intramural leiomyoma with 25-50% submucosal component. *Bilateral ovaries are visualized and appear within normal limits. Electronically Signed   By: Beula Brunswick M.D.   On: 10/08/2023 09:10    MAU COURSE CBC, Quant, ABO/Rh, ultrasound, wet prep and GC/chlamydia culture, UA  MDM - Pain and bleeding in early pregnancy with intrauterine pregnancy verified (+YS) but S<D and viability not verified. Suspect bleeding was from [facility] although not seen on US  currently. Hemodynamically stable. F/U viability US  in 2 weeks.   - Constipation may be contributing to abd pain. Already trying increasing fluids and "green rinks" without improvement. Rx Miralax.   ASSESSMENT 1. Vaginal bleeding in pregnancy, first trimester   2. Abdominal pain during pregnancy in first trimester   3. Uterine size date discrepancy pregnancy, first trimester   4. Constipation during pregnancy in first trimester     PLAN Discharge home in stable condition. SAB  precautions  Follow-up Information     [facility] Follow up in 2 day(s).   Specialty: Radiology Why: will call you to schedule ultrasound Contact information: [address] [place]  [address] [phone]        [facility] Follow up.   Specialty: Obstetrics and Gynecology Why: As needed in emergencies Contact information: [address] [place]  [phone]  Allergies as of 10/08/2023   No Known Allergies      Medication List     STOP taking these medications    Nexplanon  68 MG Impl implant Generic drug: etonogestrel       TAKE these medications    acetaminophen  500 MG tablet Commonly known as: TYLENOL  Take 2 tablets (1,000 mg total) by mouth every 6 (six) hours as needed.   famotidine  20 MG tablet Commonly known as: PEPCID  Take 1 tablet (20 mg total) by mouth at bedtime.   multivitamin tablet Take 1 tablet by mouth daily.   ondansetron  4 MG disintegrating tablet Commonly known as: ZOFRAN -ODT Take 1 tablet (4 mg total) by mouth every 8 (eight) hours as needed for nausea or vomiting.   polyethylene glycol powder 17 GM/SCOOP powder Commonly known as: GLYCOLAX/MIRALAX Take 17 g by mouth daily as needed for moderate constipation.   traMADol 50 MG tablet Commonly known as: Ultram Take 1-2 tablets (50-100 mg total) by mouth every 6 (six) hours as needed for severe pain (pain score 7-10).         Felipe Horton, Salimata Christenson , CNM 10/08/2023  10:27 AM  4

## 2023-10-09 ENCOUNTER — Encounter (HOSPITAL_COMMUNITY): Payer: Self-pay | Admitting: Obstetrics and Gynecology

## 2023-10-09 ENCOUNTER — Inpatient Hospital Stay (HOSPITAL_COMMUNITY)

## 2023-10-09 ENCOUNTER — Inpatient Hospital Stay (HOSPITAL_COMMUNITY)
Admission: AD | Admit: 2023-10-09 | Discharge: 2023-10-09 | Disposition: A | Discharge status: Home / Self Care | Payer: Self-pay | Attending: Obstetrics and Gynecology | Admitting: Obstetrics and Gynecology

## 2023-10-09 DIAGNOSIS — Z3A10 10 weeks gestation of pregnancy: Secondary | ICD-10-CM

## 2023-10-09 DIAGNOSIS — R102 Pelvic and perineal pain: Secondary | ICD-10-CM | POA: Insufficient documentation

## 2023-10-09 DIAGNOSIS — D259 Leiomyoma of uterus, unspecified: Secondary | ICD-10-CM | POA: Insufficient documentation

## 2023-10-09 DIAGNOSIS — O3411 Maternal care for benign tumor of corpus uteri, first trimester: Secondary | ICD-10-CM | POA: Insufficient documentation

## 2023-10-09 DIAGNOSIS — O209 Hemorrhage in early pregnancy, unspecified: Secondary | ICD-10-CM

## 2023-10-09 LAB — URINALYSIS, ROUTINE W REFLEX MICROSCOPIC
Bilirubin Urine: NEGATIVE
Glucose, UA: NEGATIVE mg/dL
Ketones, ur: NEGATIVE mg/dL
Leukocytes,Ua: NEGATIVE
Nitrite: NEGATIVE
Protein, ur: NEGATIVE mg/dL
Specific Gravity, Urine: 1.001 — ABNORMAL LOW (ref 1.005–1.030)
pH: 6 (ref 5.0–8.0)

## 2023-10-09 LAB — GC/CHLAMYDIA PROBE AMP (~~LOC~~) NOT AT ARMC
Chlamydia: NEGATIVE
Comment: NEGATIVE
Comment: NORMAL
Neisseria Gonorrhea: NEGATIVE

## 2023-10-09 NOTE — MAU Note (Signed)
 Autumn Benton is a 43 y.o. at [redacted]w[redacted]d here in MAU reporting:  Pt states she was here in MAU earlier yesterday for abdominal pain. Pt states she has come back to MAU due to abdominal pain and bleeding now. Around 0310 pt states she went to the bathroom and noticed bleeding and states she might have seen "something" in the toilet. She also saw some blood clots.   Pt is currently wearing a pad and lower abdominal pain 10/10 Vitals:   10/09/23 0331  BP: 122/82  Pulse: 71  Resp: 18  Temp: 98.5 F (36.9 C)     Lab orders placed from triage: UA

## 2023-10-09 NOTE — MAU Provider Note (Signed)
 Chief Complaint: Abdominal Pain and Vaginal Bleeding   Event Date/Time   First Provider Initiated Contact with Patient 10/09/23 0345        SUBJECTIVE HPI: Autumn Benton is a 43 y.o. [id number] at [redacted]w[redacted]d by LMP who presents to maternity admissions reporting pelvic cramping and bleeding.with clots.  Was seen yesterday for same, they saw a Gestational sac.  Patient states they told her "everything is fine" .     Abdominal Pain This is a recurrent problem. The quality of the pain is cramping. Pertinent negatives include no diarrhea. The pain is relieved by Nothing. She has tried nothing for the symptoms.  Vaginal Bleeding Associated symptoms include abdominal pain. Pertinent negatives include no chills or diarrhea. Nothing aggravates the symptoms. She has tried nothing for the symptoms.   RN Note:  Autumn Benton is a 43 y.o. at Unknown here in MAU reporting: she's two months pregnant and began having abdominal pain yesterday and noted spotting with wiping today.  Reports the pain is constant and feels like contractions.      Past Medical History:  Diagnosis Date   Anxiety    Arthritis 09/19/2016   Chlamydia    Dyslipidemia (high LDL; low HDL)    Miscarriage    Prediabetes    History reviewed. No pertinent surgical history. Social History   Socioeconomic History   Marital status: Single    Spouse name: Anthonette Kinsman   Number of children: 6   Years of education: 6   Highest education level: High school graduate  Occupational History   Occupation: [employer]  Tobacco Use   Smoking status: Never   Smokeless tobacco: Never  Vaping Use   Vaping status: Never Used  Substance and Sexual Activity   Alcohol use: No   Drug use: Never   Sexual activity: Yes  Other Topics Concern   Not on file  Social History Narrative      Patient just back to work with limited hours 10/22/2018 due to pandemic   Her husband has been out of work as well.   Having  difficulties paying for food and rent currently.   Lives at home with husband and 3 youngest children.   Social Drivers of Health   Financial Resource Strain: High Risk (10/22/2018)   Overall Financial Resource Strain (CARDIA)    Difficulty of Paying Living Expenses: Hard  Food Insecurity: Food Insecurity Present (12/07/2020)   Hunger Vital Sign    Worried About Running Out of Food in the Last Year: Sometimes true    Ran Out of Food in the Last Year: Sometimes true  Transportation Needs: No Transportation Needs (12/07/2020)   PRAPARE - Administrator, Civil Service (Medical): No    Lack of Transportation (Non-Medical): No  Physical Activity: Inactive (10/22/2018)   Exercise Vital Sign    Days of Exercise per Week: 0 days    Minutes of Exercise per Session: 0 min  Stress: Stress Concern Present (10/22/2018)   [employer] of Occupational Health - Occupational Stress Questionnaire    Feeling of Stress : Very much  Social Connections: Somewhat Isolated (10/22/2018)   Social Connection and Isolation Panel [NHANES]    Frequency of Communication with Friends and Family: More than three times a week    Frequency of Social Gatherings with Friends and Family: Never    Attends Religious Services: Never    Database administrator or Organizations: No    Attends Banker Meetings: Never  Marital Status: Married  Catering manager Violence: Not At Risk (10/22/2018)   Humiliation, Afraid, Rape, and Kick questionnaire    Fear of Current or Ex-Partner: No    Emotionally Abused: No    Physically Abused: No    Sexually Abused: No   No current facility-administered medications on file prior to encounter.   Current Outpatient Medications on File Prior to Encounter  Medication Sig Dispense Refill   acetaminophen  (TYLENOL ) 500 MG tablet Take 2 tablets (1,000 mg total) by mouth every 6 (six) hours as needed.     famotidine  (PEPCID ) 20 MG tablet Take 1 tablet (20 mg total) by  mouth at bedtime. 30 tablet 0   Multiple Vitamin (MULTIVITAMIN) tablet Take 1 tablet by mouth daily. 30 tablet 3   ondansetron  (ZOFRAN -ODT) 4 MG disintegrating tablet Take 1 tablet (4 mg total) by mouth every 8 (eight) hours as needed for nausea or vomiting. 20 tablet 0   polyethylene glycol powder (GLYCOLAX/MIRALAX) 17 GM/SCOOP powder Take 17 g by mouth daily as needed for moderate constipation. 500 g 2   traMADol (ULTRAM) 50 MG tablet Take 1-2 tablets (50-100 mg total) by mouth every 6 (six) hours as needed for severe pain (pain score 7-10). 30 tablet 0   No Known Allergies  I have reviewed patient's Past Medical Hx, Surgical Hx, Family Hx, Social Hx, medications and allergies.   ROS:  Review of Systems  Constitutional:  Negative for chills.  Gastrointestinal:  Positive for abdominal pain. Negative for diarrhea.  Genitourinary:  Positive for vaginal bleeding.   Review of Systems  Other systems negative   Physical Exam  Physical Exam Patient Vitals for the past 24 hrs:  BP Temp Temp src Pulse Resp Height Weight  10/09/23 0331 122/82 98.5 F (36.9 C) Oral 71 18 -- --  10/09/23 0328 -- -- -- -- -- 2' 1.2" (0.64 m) 83.3 kg   Constitutional: Well-developed, well-nourished female in no acute distress.  Cardiovascular: normal rate Respiratory: normal effort GI: Abd soft, non-tender. MS: Extremities nontender, no edema, normal ROM Neurologic: Alert and oriented x 4.   LAB RESULTS Results for orders placed or performed during the hospital encounter of 10/08/23 (from the past 24 hours)  Pregnancy, urine POC     Status: Abnormal   Collection Time: 10/08/23  8:15 AM  Result Value Ref Range   Preg Test, Ur POSITIVE (A) NEGATIVE  Urinalysis, Routine w reflex microscopic -Urine, Clean Catch     Status: None   Collection Time: 10/08/23  8:17 AM  Result Value Ref Range   Color, Urine YELLOW YELLOW   APPearance CLEAR CLEAR   Specific Gravity, Urine 1.017 1.005 - 1.030   pH 5.0 5.0 -  8.0   Glucose, UA NEGATIVE NEGATIVE mg/dL   Hgb urine dipstick NEGATIVE NEGATIVE   Bilirubin Urine NEGATIVE NEGATIVE   Ketones, ur NEGATIVE NEGATIVE mg/dL   Protein, ur NEGATIVE NEGATIVE mg/dL   Nitrite NEGATIVE NEGATIVE   Leukocytes,Ua NEGATIVE NEGATIVE  CBC     Status: None   Collection Time: 10/08/23  8:33 AM  Result Value Ref Range   WBC 7.3 4.0 - 10.5 K/uL   RBC 4.77 3.87 - 5.11 MIL/uL   Hemoglobin 14.7 12.0 - 15.0 g/dL   HCT 16.1 09.6 - 04.5 %   MCV 88.7 80.0 - 100.0 fL   MCH 30.8 26.0 - 34.0 pg   MCHC 34.8 30.0 - 36.0 g/dL   RDW 40.9 81.1 - 91.4 %   Platelets 229 150 -  400 K/uL   nRBC 0.0 0.0 - 0.2 %  hCG, quantitative, pregnancy     Status: Abnormal   Collection Time: 10/08/23  8:33 AM  Result Value Ref Range   hCG, Beta Chain, Quant, S 2,999 (H) <5 mIU/mL  Wet prep, genital     Status: None   Collection Time: 10/08/23  8:35 AM   Specimen: PATH Cytology Cervicovaginal Ancillary Only  Result Value Ref Range   Yeast Wet Prep HPF POC NONE SEEN NONE SEEN   Trich, Wet Prep NONE SEEN NONE SEEN   Clue Cells Wet Prep HPF POC NONE SEEN NONE SEEN   WBC, Wet Prep HPF POC <10 <10   Sperm NONE SEEN        IMAGING US  OB Transvaginal Result Date: 10/09/2023 CLINICAL DATA:  43 year old female with cramping and bleeding in the 1st trimester of pregnancy. EXAM: TRANSVAGINAL OB ULTRASOUND TECHNIQUE: Transvaginal ultrasound was performed for complete evaluation of the gestation as well as the maternal uterus, adnexal regions, and pelvic cul-de-sac. COMPARISON:  Ultrasound yesterday 0836 hours. FINDINGS: Intrauterine gestational sac: Single, stable since yesterday. Yolk sac:  Not visible Embryo:  Not visible Cardiac Activity: Not applicable Heart Rate: Not applicable bpm MSD: 19.4 mm 6 w 6 d (versus measuring 6 weeks and 5 days on the exam yesterday). Subchorionic hemorrhage:  None visualized. Maternal uterus/adnexae: Fibroid uterus redemonstrated. The most discrete fibroid is that  measuring about 2.2 cm again noted on image 17. But other uterine heterogeneity and enlargement suggests additional fibroids. Trace if any pelvis free fluid. IMPRESSION: 1. Probable early intrauterine gestational sac, but no yolk sac, fetal pole, or cardiac activity yet visualized. Recommend follow-up quantitative B-HCG levels and follow-up US  in 14 days to confirm and assess viability. This recommendation follows SRU consensus guidelines: Diagnostic Criteria for Nonviable Pregnancy Early in the First Trimester. Mel Spine Med 2013; 409:8119-14. 2. Fibroid uterus. Electronically Signed   By: Marlise Simpers M.D.   On: 10/09/2023 04:52   US  OB LESS THAN 14 WEEKS WITH OB TRANSVAGINAL Result Date: 10/08/2023 CLINICAL DATA:  [id number] Vaginal bleeding in pregnancy, first trimester [id number] Abdominal pain during pregnancy in first trimester 1470026. EXAM: OBSTETRIC <14 WK US  AND TRANSVAGINAL OB US  TECHNIQUE: Both transabdominal and transvaginal ultrasound examinations were performed for complete evaluation of the gestation as well as the maternal uterus, adnexal regions, and pelvic cul-de-sac. Transvaginal technique was performed to assess early pregnancy. COMPARISON:  None Available. FINDINGS: Intrauterine gestational sac: Single Yolk sac:  Not Visualized. Embryo:  Not Visualized. MSD: 17.9 mm   6 w   5 d Subchorionic hemorrhage:  None visualized. Maternal uterus/adnexae: Normal-size anteverted uterus. Note is made of an approximately 1.9 x 2.2 x 2.6 cm anterior uterine body intramural leiomyoma with 25-50% submucosal component. Bilateral ovaries are visualized and appear within normal limits. IMPRESSION: *Single intrauterine gestational sac with mean sac diameter corresponding to 6 weeks 5 days. *There is a 2.6 cm anterior uterine body intramural leiomyoma with 25-50% submucosal component. *Bilateral ovaries are visualized and appear within normal limits. Electronically Signed   By: Beula Brunswick M.D.   On: 10/08/2023  09:10     MAU Management/MDM: I have reviewed the triage vital signs and the nursing notes.   Pertinent labs & imaging results that were available during my care of the patient were reviewed by me and considered in my medical decision making (see chart for details).      I have reviewed her medical records including past  results, notes and treatments. Medical, Surgical, and family history were reviewed.  Medications and recent lab tests were reviewed  Ordered repeat Ultrasound to rule out SAB  this showed stable gestational sac.  This bleeding/pain can represent a normal pregnancy with bleeding, spontaneous abortion or even an ectopic which can be life-threatening.  The process as listed above helps to determine which of these is present.    ASSESSMENT Pregnancy at [redacted]w[redacted]d by LMP Vaginal bleeding Pelvic cramping Gestational sac seen  PLAN Discharge home Explained via interpretor (ipad) that pregnancy is stable for now SAB precautions   Pt stable at time of discharge. Encouraged to return here if she develops worsening of symptoms, increase in pain, fever, or other concerning symptoms.    Holmes Lusher CNM, MSN Certified Nurse-Midwife 10/09/2023  3:45 AM

## 2023-10-10 ENCOUNTER — Telehealth: Payer: Self-pay | Admitting: General Practice

## 2023-10-10 DIAGNOSIS — O3680X0 Pregnancy with inconclusive fetal viability, not applicable or unspecified: Secondary | ICD-10-CM

## 2023-10-10 NOTE — Telephone Encounter (Signed)
-----   Message from [place]  Smith sent at 10/08/2023 10:19 AM EDT ----- Regarding: Schedule Viability US  Pt was seen in MAU 5/14 for bleeding and cramping. YS  seen but S<D, 43 years old. Please scheduled viability US  in 2 weeks.

## 2023-10-10 NOTE — Telephone Encounter (Signed)
 Called patient with Autumn Benton and scheduled appt for 6/2 @ 855 per patient request.

## 2023-10-12 ENCOUNTER — Inpatient Hospital Stay (HOSPITAL_COMMUNITY)

## 2023-10-12 ENCOUNTER — Inpatient Hospital Stay (HOSPITAL_COMMUNITY)
Admission: AD | Admit: 2023-10-12 | Discharge: 2023-10-12 | Disposition: A | Discharge status: Home / Self Care | Payer: Self-pay | Attending: Obstetrics & Gynecology | Admitting: Obstetrics & Gynecology

## 2023-10-12 ENCOUNTER — Encounter (HOSPITAL_COMMUNITY): Payer: Self-pay | Admitting: Obstetrics and Gynecology

## 2023-10-12 DIAGNOSIS — O039 Complete or unspecified spontaneous abortion without complication: Secondary | ICD-10-CM

## 2023-10-12 DIAGNOSIS — R519 Headache, unspecified: Secondary | ICD-10-CM

## 2023-10-12 DIAGNOSIS — Z3A01 Less than 8 weeks gestation of pregnancy: Secondary | ICD-10-CM

## 2023-10-12 DIAGNOSIS — Z603 Acculturation difficulty: Secondary | ICD-10-CM | POA: Insufficient documentation

## 2023-10-12 LAB — CBC
HCT: 37.1 % (ref 36.0–46.0)
Hemoglobin: 12.9 g/dL (ref 12.0–15.0)
MCH: 30.4 pg (ref 26.0–34.0)
MCHC: 34.8 g/dL (ref 30.0–36.0)
MCV: 87.5 fL (ref 80.0–100.0)
Platelets: 219 10*3/uL (ref 150–400)
RBC: 4.24 MIL/uL (ref 3.87–5.11)
RDW: 13.2 % (ref 11.5–15.5)
WBC: 6.4 10*3/uL (ref 4.0–10.5)
nRBC: 0 % (ref 0.0–0.2)

## 2023-10-12 LAB — HCG, QUANTITATIVE, PREGNANCY: hCG, Beta Chain, Quant, S: 375 m[IU]/mL — ABNORMAL HIGH (ref <5)

## 2023-10-12 MED ORDER — ACETAMINOPHEN-CAFFEINE 500-65 MG PO TABS
2.0000 | ORAL_TABLET | Freq: Once | ORAL | Status: AC
  Administered 2023-10-12: 2 via ORAL
  Filled 2023-10-12: qty 2

## 2023-10-12 NOTE — MAU Provider Note (Addendum)
 History     CSN: [id number]  Arrival date and time: 10/12/23 0602     Chief Complaint  Patient presents with   Vaginal Bleeding   [name] is a 43 y.o. year old [id number] female at [redacted]w[redacted]d weeks gestation who presents to MAU reporting heavy vaginal bleeding and headache that has been going on since last Tuesday. She was evaluated in MAU last week for headache and vaginal bleeding and was determined to have an IUP measuring 6.[redacted] weeks gestation with no fetal pole or yolk sac. Her bleeding and cramping increased on Thursday and Friday. She denies passing clots or tissue and denies current abdominal pain. She reports a PPD after her last pregnancy. She is fearful that will happen again. She is not symptomatic at this time. She is interested in going on anti-depression medications.     Vaginal Bleeding Associated symptoms include headaches.    OB History     Gravida  8   Para  6   Term  6   Preterm      AB      Living  6      SAB      IAB      Ectopic      Multiple      Live Births  1           Past Medical History:  Diagnosis Date   Anxiety    Arthritis 09/19/2016   Chlamydia    Dyslipidemia (high LDL; low HDL)    Miscarriage    Prediabetes     No past surgical history on file.  Family History  Problem Relation Age of Onset   Alcohol abuse Father        had kidney and liver disease at one point related to ETOH intake, but patient states those resolved.    Social History   Tobacco Use   Smoking status: Never   Smokeless tobacco: Never  Vaping Use   Vaping status: Never Used  Substance Use Topics   Alcohol use: No   Drug use: Never    Allergies: No Known Allergies  Medications Prior to Admission  Medication Sig Dispense Refill Last Dose/Taking   acetaminophen  (TYLENOL ) 500 MG tablet Take 2 tablets (1,000 mg total) by mouth every 6 (six) hours as needed.      famotidine  (PEPCID ) 20 MG tablet Take 1 tablet (20 mg total) by  mouth at bedtime. 30 tablet 0    Multiple Vitamin (MULTIVITAMIN) tablet Take 1 tablet by mouth daily. 30 tablet 3    ondansetron  (ZOFRAN -ODT) 4 MG disintegrating tablet Take 1 tablet (4 mg total) by mouth every 8 (eight) hours as needed for nausea or vomiting. 20 tablet 0    polyethylene glycol powder (GLYCOLAX /MIRALAX ) 17 GM/SCOOP powder Take 17 g by mouth daily as needed for moderate constipation. 500 g 2    traMADol  (ULTRAM ) 50 MG tablet Take 1-2 tablets (50-100 mg total) by mouth every 6 (six) hours as needed for severe pain (pain score 7-10). 30 tablet 0     Review of Systems  Constitutional: Negative.   HENT: Negative.    Eyes: Negative.   Respiratory: Negative.    Cardiovascular: Negative.   Gastrointestinal: Negative.   Genitourinary:  Positive for vaginal bleeding.  Musculoskeletal: Negative.   Skin: Negative.   Allergic/Immunologic: Negative.   Neurological:  Positive for dizziness and headaches.  Hematological: Negative.   Psychiatric/Behavioral: Negative.     Physical Exam   Blood  pressure 133/75, pulse 82, temperature 98.3 F (36.8 C), temperature source Oral, resp. rate 18, height 2' 1.2" (0.64 m), weight 83.5 kg, last menstrual period 07/26/2023, SpO2 98%.  Physical Exam Vitals and nursing note reviewed. Exam conducted with a chaperone present.  Constitutional:      Appearance: Normal appearance. She is obese.  HENT:     Head: Normocephalic and atraumatic.  Cardiovascular:     Rate and Rhythm: Normal rate.  Pulmonary:     Effort: Pulmonary effort is normal.  Abdominal:     Palpations: Abdomen is soft.  Genitourinary:    General: Normal vulva.     Vagina: Bleeding present.     Cervix: Normal.     Comments: Pelvic exam: External genitalia normal, SE: vaginal walls pink and well rugated, cervix is smooth, pink, no lesions, small amt of dark, reddish-brown blood --  cervix visually closed. Bimanual deferred. Skin:    General: Skin is warm and dry.   Neurological:     Mental Status: She is alert and oriented to person, place, and time.   MAU Course  Procedures Results for orders placed or performed during the hospital encounter of 10/12/23 (from the past 24 hours)  hCG, quantitative, pregnancy     Status: Abnormal   Collection Time: 10/12/23  6:45 AM  Result Value Ref Range   hCG, Beta Chain, Quant, S 375 (H) <5 mIU/mL  CBC     Status: None   Collection Time: 10/12/23  6:45 AM  Result Value Ref Range   WBC 6.4 4.0 - 10.5 K/uL   RBC 4.24 3.87 - 5.11 MIL/uL   Hemoglobin 12.9 12.0 - 15.0 g/dL   HCT 16.1 09.6 - 04.5 %   MCV 87.5 80.0 - 100.0 fL   MCH 30.4 26.0 - 34.0 pg   MCHC 34.8 30.0 - 36.0 g/dL   RDW 40.9 81.1 - 91.4 %   Platelets 219 150 - 400 K/uL   nRBC 0.0 0.0 - 0.2 %   MDM Orders Placed This Encounter  Procedures   US  OB Transvaginal   hCG, quantitative, pregnancy   CBC   Orthostatic vital signs   US  OB Transvaginal Result Date: 10/12/2023 CLINICAL DATA:  Vaginal bleeding.  Passing clots. EXAM: OBSTETRIC <14 WK ULTRASOUND TECHNIQUE: Transabdominal ultrasound was performed for evaluation of the gestation as well as the maternal uterus and adnexal regions. COMPARISON:  Oct 09, 2023. FINDINGS: Intrauterine gestational sac: None Yolk sac:  Not Visualized. Embryo:  Not Visualized. Cardiac Activity: Not Visualized. Subchorionic hemorrhage:  None visualized. Maternal uterus/adnexae: Ovaries are unremarkable. No free fluid is noted. Two fibroids are noted, the largest measuring 1.6 cm. Endometrium is thickened. IMPRESSION: No intrauterine gestational sac, yolk sac, fetal pole, or cardiac activity visualized. Intrauterine fluid collection noted on prior exam is no longer visualized. Differential considerations include spontaneous abortion, intrauterine gestation too early to be sonographically visualized, or ectopic pregnancy. Consider follow-up ultrasound in 14 days and serial quantitative beta HCG follow-up. Electronically Signed    By: Rosalene Colon M.D.   On: 10/12/2023 09:11     Assessment and Plan  1. Spontaneous miscarriage (Primary) -Follow up in the office for repeat serum quant next week. -Return precautions for increased bleeding, dizziness, or any other pregnancy related concerns  2. Acute nonintractable headache, unspecified headache type -Advised to purchase Excedrin tension headache OTC  3. [redacted] weeks gestation of pregnancy  4. Language Barrier Affecting Care - [employer] Spanish Interpreter, [place] used for entire  visit     - Discharge home - Repeat HCG in 1 week at [facility]. Scheduled per patient's preference on 10/21/2023 @ 1320. - Mood check appt with Behavioral Health Provider at [facility] - Appt with provider in 2 weeks  Wilford Hanks 10/12/2023, 10:07 AM   Attestation of Supervision of Student:  I confirm that I have verified the information documented in the nurse midwife student's note and that I have also personally reperformed the history, physical exam and all medical decision making activities.  I have verified that all services and findings are accurately documented in this student's note; and I agree with management and plan as outlined in the documentation. I have also made any necessary editorial changes.  Additions to HPI and plan added within note above.  Raley Novicki, CNM Center for [employer], [facility] 10/12/2023 11:36 AM

## 2023-10-12 NOTE — Discharge Instructions (Signed)
    [facility]     [facility]                    [facility]                 [facility]    **Purchase ONE of these store-brand Excedrin Tension Headache medications. Take it as directed for relief of headache. DO NOT TAKE AT THE SAME TIME AS TYLENOL /ACETAMINOPHEN **

## 2023-10-12 NOTE — MAU Note (Signed)
 Autumn Benton is a 43 y.o. at [redacted]w[redacted]d here in MAU reporting:  Pt states she was recently seen here in MAU. Bleeding has not stopped and she is filling up 4 large pads/day since she was last seen here in MAU.  Pt states she sees multiple quarter size clots. Pt reports feeling dizzy from her blood loss.   Pt reports not being able to get her prescribed medication from the [facility] pharmacy. So she has not been able to take anything for pain.    Pain score: Currently no pain.  Vitals:   10/12/23 0634  BP: 133/75  Pulse: 82  Resp: 18  Temp: 98.3 F (36.8 C)  SpO2: 98%

## 2023-10-21 ENCOUNTER — Other Ambulatory Visit

## 2023-10-21 ENCOUNTER — Other Ambulatory Visit: Payer: Self-pay

## 2023-10-22 ENCOUNTER — Other Ambulatory Visit: Payer: Self-pay

## 2023-10-22 DIAGNOSIS — O3680X0 Pregnancy with inconclusive fetal viability, not applicable or unspecified: Secondary | ICD-10-CM

## 2023-10-23 LAB — BETA HCG QUANT (REF LAB): hCG Quant: 48 m[IU]/mL

## 2023-10-27 ENCOUNTER — Other Ambulatory Visit

## 2023-10-27 ENCOUNTER — Ambulatory Visit: Payer: Self-pay | Admitting: Obstetrics and Gynecology

## 2023-10-28 ENCOUNTER — Ambulatory Visit: Admitting: Obstetrics and Gynecology

## 2023-10-28 ENCOUNTER — Ambulatory Visit: Payer: Self-pay | Admitting: Licensed Clinical Social Worker

## 2023-10-28 DIAGNOSIS — Z91199 Patient's noncompliance with other medical treatment and regimen due to unspecified reason: Secondary | ICD-10-CM

## 2023-10-28 NOTE — BH Specialist Note (Signed)
 Patient no showed to this appointment today. A virtual link was sent and a phone call was provided. [facility] left a message encouraging patient to return missed call.

## 2023-11-04 ENCOUNTER — Other Ambulatory Visit: Payer: Self-pay

## 2023-11-04 ENCOUNTER — Inpatient Hospital Stay (HOSPITAL_COMMUNITY)
Admission: AD | Admit: 2023-11-04 | Discharge: 2023-11-04 | Disposition: A | Discharge status: Home / Self Care | Attending: Obstetrics and Gynecology | Admitting: Obstetrics and Gynecology

## 2023-11-04 ENCOUNTER — Encounter (HOSPITAL_COMMUNITY): Payer: Self-pay | Admitting: Obstetrics and Gynecology

## 2023-11-04 DIAGNOSIS — N946 Dysmenorrhea, unspecified: Secondary | ICD-10-CM

## 2023-11-04 DIAGNOSIS — N939 Abnormal uterine and vaginal bleeding, unspecified: Secondary | ICD-10-CM | POA: Insufficient documentation

## 2023-11-04 LAB — URINALYSIS, ROUTINE W REFLEX MICROSCOPIC
Bilirubin Urine: NEGATIVE
Glucose, UA: NEGATIVE mg/dL
Ketones, ur: NEGATIVE mg/dL
Leukocytes,Ua: NEGATIVE
Nitrite: NEGATIVE
Protein, ur: NEGATIVE mg/dL
Specific Gravity, Urine: 1.027 (ref 1.005–1.030)
pH: 5 (ref 5.0–8.0)

## 2023-11-04 LAB — POCT PREGNANCY, URINE: Preg Test, Ur: NEGATIVE

## 2023-11-04 NOTE — MAU Provider Note (Signed)
 S [name] is a 43 y.o. [phone] female who presents to MAU today with complaint of bleeding. Miscarriage in May. BHCG w on 5/28 was 48. UPT today negative. Reports moderate VB and hip pain. She reports bleeding due to miscarriage that has not stopped. She feels this is too long of a time of bleeding. She had an appt on 6/3 at [facility] but did not attend. She reports having clots and pain. She has tried tylenol  and ibuprofen .   ROS: Bleeding  O BP 124/86 (BP Location: Right Arm)   Pulse 68   Temp 98.2 F (36.8 C) (Oral)   Resp 18   LMP 07/26/2023   SpO2 100%  Physical Exam Vitals and nursing note reviewed.  Constitutional:      General: She is not in acute distress.    Appearance: She is well-developed.  HENT:     Head: Normocephalic and atraumatic.  Eyes:     General: No scleral icterus.    Conjunctiva/sclera: Conjunctivae normal.  Cardiovascular:     Rate and Rhythm: Normal rate.  Pulmonary:     Effort: Pulmonary effort is normal.  Chest:     Chest wall: No tenderness.  Abdominal:     Palpations: Abdomen is soft.     Tenderness: There is no abdominal tenderness. There is no guarding or rebound.  Genitourinary:    Vagina: Normal.  Musculoskeletal:        General: Normal range of motion.     Cervical back: Normal range of motion and neck supple.  Skin:    General: Skin is warm and dry.     Findings: No rash.  Neurological:     Mental Status: She is alert and oriented to person, place, and time.     MDM: low  A Likely menses/Dysmenorrhea   P Discharge from MAU in stable condition Discussed going to main ER for complaints Recommended OTC medications  If pain is severe recommended urgent care or ER follow up Given address and phone number for GYN office to reschedule.    Abner Ables, MD 11/04/2023 12:52 PM

## 2023-11-04 NOTE — MAU Note (Signed)
 Autumn Benton is a 43 y.o. at [redacted]w[redacted]d here in MAU reporting: she had a miscarriage last month and has continued to have moderate VB and abdominal pain/cramping and hip pain.  States was instructed by PCP to be seen in MAU.  LMP: 07/26/2023 Onset of complaint: 1 week Pain score: 8 Vitals:   11/04/23 1142  BP: 124/86  Pulse: 68  Resp: 18  Temp: 98.2 F (36.8 C)  SpO2: 100%     FHT: NA  Lab orders placed from triage: UA

## 2023-11-17 ENCOUNTER — Other Ambulatory Visit: Payer: Self-pay | Admitting: Obstetrics and Gynecology

## 2023-11-17 ENCOUNTER — Ambulatory Visit (INDEPENDENT_AMBULATORY_CARE_PROVIDER_SITE_OTHER): Payer: Self-pay | Admitting: Obstetrics and Gynecology

## 2023-11-17 ENCOUNTER — Encounter: Payer: Self-pay | Admitting: Obstetrics and Gynecology

## 2023-11-17 VITALS — BP 134/87 | HR 94 | Ht 63.5 in | Wt 181.0 lb

## 2023-11-17 DIAGNOSIS — O039 Complete or unspecified spontaneous abortion without complication: Secondary | ICD-10-CM

## 2023-11-17 DIAGNOSIS — F32A Depression, unspecified: Secondary | ICD-10-CM

## 2023-11-17 DIAGNOSIS — Z1231 Encounter for screening mammogram for malignant neoplasm of breast: Secondary | ICD-10-CM

## 2023-11-17 NOTE — Progress Notes (Addendum)
 43 y.o. GYN presents for SAB Follow up.  Pt c/o HA 7/10, tingling and numbness on left side in arms and neck, chest pain x 2 weeks, vaginal bleeding since 10/09/23.  PHQ-9=14 GAD-7=15 Referral placed.
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
## 2023-12-12 ENCOUNTER — Other Ambulatory Visit (HOSPITAL_COMMUNITY): Payer: Self-pay | Admitting: Nurse Practitioner

## 2023-12-12 DIAGNOSIS — R1031 Right lower quadrant pain: Secondary | ICD-10-CM

## 2023-12-16 ENCOUNTER — Ambulatory Visit (HOSPITAL_COMMUNITY): Admission: RE | Admit: 2023-12-16 | Payer: Self-pay | Source: Ambulatory Visit

## 2023-12-16 ENCOUNTER — Encounter (HOSPITAL_COMMUNITY): Payer: Self-pay

## 2023-12-19 ENCOUNTER — Ambulatory Visit (HOSPITAL_COMMUNITY)
Admission: RE | Admit: 2023-12-19 | Discharge: 2023-12-19 | Disposition: A | Discharge status: Home / Self Care | Payer: Self-pay | Source: Ambulatory Visit | Attending: Nurse Practitioner | Admitting: Nurse Practitioner

## 2023-12-19 DIAGNOSIS — R1031 Right lower quadrant pain: Secondary | ICD-10-CM | POA: Insufficient documentation

## 2024-02-10 ENCOUNTER — Ambulatory Visit: Payer: Self-pay
